# Patient Record
Sex: Female | Born: 1985 | Race: Black or African American | Hispanic: No | Marital: Married | Smoking: Never smoker
Health system: Southern US, Community
[De-identification: ages and names within clinical notes are randomized; demographics above are authoritative.]

## PROBLEM LIST (undated history)

## (undated) DIAGNOSIS — D649 Anemia, unspecified: Secondary | ICD-10-CM

## (undated) DIAGNOSIS — K219 Gastro-esophageal reflux disease without esophagitis: Secondary | ICD-10-CM

## (undated) DIAGNOSIS — K829 Disease of gallbladder, unspecified: Secondary | ICD-10-CM

---

## 2007-03-09 ENCOUNTER — Emergency Department (HOSPITAL_COMMUNITY): Admission: EM | Admit: 2007-03-09 | Discharge: 2007-03-09 | Payer: Self-pay | Admitting: Emergency Medicine

## 2009-12-10 ENCOUNTER — Other Ambulatory Visit: Admission: RE | Admit: 2009-12-10 | Discharge: 2009-12-10 | Payer: Self-pay | Admitting: Obstetrics & Gynecology

## 2009-12-25 ENCOUNTER — Emergency Department (HOSPITAL_COMMUNITY)
Admission: EM | Admit: 2009-12-25 | Discharge: 2009-12-26 | Payer: Self-pay | Source: Home / Self Care | Admitting: Emergency Medicine

## 2010-04-24 ENCOUNTER — Inpatient Hospital Stay (HOSPITAL_COMMUNITY): Admission: AD | Admit: 2010-04-24 | Discharge: 2010-04-27 | Payer: Self-pay | Admitting: Obstetrics & Gynecology

## 2010-04-24 ENCOUNTER — Ambulatory Visit: Payer: Self-pay | Admitting: Advanced Practice Midwife

## 2010-08-17 LAB — COMPREHENSIVE METABOLIC PANEL
BUN: 6 mg/dL (ref 6–23)
Calcium: 8.8 mg/dL (ref 8.4–10.5)
Chloride: 106 mEq/L (ref 96–112)
Creatinine, Ser: 0.8 mg/dL (ref 0.4–1.2)
GFR calc Af Amer: >60 mL/min (ref >60)
GFR calc non Af Amer: >60 mL/min (ref >60)
Potassium: 3.8 mEq/L (ref 3.5–5.1)
Sodium: 135 mEq/L (ref 135–145)

## 2010-08-17 LAB — CBC
HCT: 32.4 % — ABNORMAL LOW (ref 36.0–46.0)
HCT: 32.4 % — ABNORMAL LOW (ref 36.0–46.0)
MCH: 25.7 pg — ABNORMAL LOW (ref 26.0–34.0)
MCHC: 32.7 g/dL (ref 30.0–36.0)
MCV: 78.8 fL (ref 78.0–100.0)
Platelets: 282 10*3/uL (ref 150–400)
RBC: 4.11 MIL/uL (ref 3.87–5.11)
RDW: 16.2 % — ABNORMAL HIGH (ref 11.5–15.5)

## 2010-08-17 LAB — MRSA PCR SCREENING: MRSA by PCR: NEGATIVE

## 2010-08-21 LAB — DIFFERENTIAL
Eosinophils Absolute: 0.2 10*3/uL (ref 0.0–0.7)
Eosinophils Relative: 1 % (ref 0–5)
Lymphocytes Relative: 24 % (ref 12–46)
Lymphs Abs: 2.7 10*3/uL (ref 0.7–4.0)
Monocytes Absolute: 0.7 10*3/uL (ref 0.1–1.0)
Monocytes Relative: 6 % (ref 3–12)
Neutrophils Relative %: 68 % (ref 43–77)

## 2010-08-21 LAB — URINALYSIS, ROUTINE W REFLEX MICROSCOPIC

## 2010-08-21 LAB — CBC
HCT: 32.2 % — ABNORMAL LOW (ref 36.0–46.0)
MCH: 27.7 pg (ref 26.0–34.0)
RBC: 3.91 MIL/uL (ref 3.87–5.11)
RDW: 14.6 % (ref 11.5–15.5)

## 2010-08-21 LAB — BASIC METABOLIC PANEL
Creatinine, Ser: 0.62 mg/dL (ref 0.4–1.2)
GFR calc non Af Amer: >60 mL/min (ref >60)
Glucose, Bld: 87 mg/dL (ref 70–99)

## 2010-08-21 LAB — URINE MICROSCOPIC-ADD ON

## 2012-01-10 LAB — OB RESULTS CONSOLE ANTIBODY SCREEN: Antibody Screen: NEGATIVE

## 2012-01-10 LAB — OB RESULTS CONSOLE VARICELLA ZOSTER ANTIBODY, IGG: Varicella: IMMUNE

## 2012-01-10 LAB — OB RESULTS CONSOLE ABO/RH

## 2012-01-10 LAB — OB RESULTS CONSOLE GC/CHLAMYDIA: Chlamydia: NEGATIVE

## 2012-01-10 LAB — OB RESULTS CONSOLE RPR: RPR: NONREACTIVE

## 2012-01-10 LAB — OB RESULTS CONSOLE HEPATITIS B SURFACE ANTIGEN: Hepatitis B Surface Ag: NEGATIVE

## 2012-01-10 LAB — OB RESULTS CONSOLE RUBELLA ANTIBODY, IGM: Rubella: NON-IMMUNE/NOT IMMUNE

## 2012-01-12 ENCOUNTER — Other Ambulatory Visit (HOSPITAL_COMMUNITY)
Admission: RE | Admit: 2012-01-12 | Discharge: 2012-01-12 | Disposition: A | Discharge status: Home / Self Care | Payer: 59 | Source: Ambulatory Visit | Attending: Obstetrics and Gynecology | Admitting: Obstetrics and Gynecology

## 2012-01-12 ENCOUNTER — Other Ambulatory Visit: Payer: Self-pay | Admitting: Adult Health

## 2012-01-12 DIAGNOSIS — Z113 Encounter for screening for infections with a predominantly sexual mode of transmission: Secondary | ICD-10-CM | POA: Insufficient documentation

## 2012-01-12 DIAGNOSIS — Z01419 Encounter for gynecological examination (general) (routine) without abnormal findings: Secondary | ICD-10-CM | POA: Insufficient documentation

## 2012-01-14 ENCOUNTER — Encounter (HOSPITAL_COMMUNITY): Payer: Self-pay | Admitting: *Deleted

## 2012-01-14 ENCOUNTER — Emergency Department (HOSPITAL_COMMUNITY)
Admission: EM | Admit: 2012-01-14 | Discharge: 2012-01-14 | Disposition: A | Discharge status: Home / Self Care | Payer: 59 | Attending: Emergency Medicine | Admitting: Emergency Medicine

## 2012-01-14 DIAGNOSIS — O2390 Unspecified genitourinary tract infection in pregnancy, unspecified trimester: Secondary | ICD-10-CM | POA: Insufficient documentation

## 2012-01-14 DIAGNOSIS — N390 Urinary tract infection, site not specified: Secondary | ICD-10-CM | POA: Insufficient documentation

## 2012-01-14 DIAGNOSIS — Z3490 Encounter for supervision of normal pregnancy, unspecified, unspecified trimester: Secondary | ICD-10-CM

## 2012-01-14 HISTORY — DX: Disease of gallbladder, unspecified: K82.9

## 2012-01-14 LAB — URINALYSIS, ROUTINE W REFLEX MICROSCOPIC
Glucose, UA: NEGATIVE mg/dL
Nitrite: NEGATIVE

## 2012-01-14 LAB — URINE MICROSCOPIC-ADD ON

## 2012-01-14 LAB — CBC WITH DIFFERENTIAL/PLATELET
Basophils Absolute: 0 10*3/uL (ref 0.0–0.1)
Eosinophils Absolute: 0.1 10*3/uL (ref 0.0–0.7)
Hemoglobin: 10.1 g/dL — ABNORMAL LOW (ref 12.0–15.0)
Lymphocytes Relative: 35 % (ref 12–46)
Lymphs Abs: 2 10*3/uL (ref 0.7–4.0)
MCHC: 32.8 g/dL (ref 30.0–36.0)
MCV: 74.6 fL — ABNORMAL LOW (ref 78.0–100.0)
Monocytes Absolute: 0.3 10*3/uL (ref 0.1–1.0)
Neutrophils Relative %: 58 % (ref 43–77)
Platelets: 335 10*3/uL (ref 150–400)
RBC: 4.13 MIL/uL (ref 3.87–5.11)

## 2012-01-14 LAB — BASIC METABOLIC PANEL: Sodium: 133 mEq/L — ABNORMAL LOW (ref 135–145)

## 2012-01-14 MED ORDER — AMOXICILLIN 500 MG PO CAPS: 500.0000 mg | ORAL_CAPSULE | Freq: Three times a day (TID) | ORAL | Status: AC

## 2012-01-14 MED ORDER — AMOXICILLIN 250 MG PO CAPS
500.0000 mg | ORAL_CAPSULE | Freq: Once | ORAL | Status: AC
  Administered 2012-01-14: 500 mg via ORAL
  Filled 2012-01-14: qty 2

## 2012-01-14 NOTE — ED Notes (Signed)
Discharge instructions reviewed with pt, questions answered. Pt verbalized understanding.  

## 2012-01-14 NOTE — ED Provider Notes (Signed)
History  This chart was scribed for Donnetta Hutching, MD by Bennett Scrape. This patient was seen in room APA04/APA04 and the patient's care was started at 11:35AM.  CSN: [id number]  Arrival date & time 01/14/12  1027   First MD Initiated Contact with Patient 01/14/12 1135      Chief Complaint  Patient presents with  . Abdominal Pain  . Vaginal Bleeding    The history is provided by the patient. No language interpreter was used.    Kellie Clark is a 26 y.o. female who is currently [redacted] weeks pregnant (confirmed by Dr. Rayna Sexton office) who presents to the Emergency Department complaining of 24 hours of intermittent vaginal bleeding described as light spotting that has increased to clotting with associated abdominal cramping. Pt states that she started spotting light red blood yesterday for about one hour. She reports that the spotting started back up this morning with increased clotting and associated abdominal cramping. She reports that her LNMP was 11/17/2011 and her due date is 08/23/2012. She is G2P1 and reports that the first pregnancy was full term with no complications other than a mild gallbladder inflammation that resolved after delivery. She denies having any current gallbladder issues with this pregnancy. She reports that she was seen by Dr. Emelda Fear 4 days ago and had a normal [place]. She denies having any modifying factors. She denies having prior episodes of similar symptoms during either pregnancy. She denies fever, sore throat, visual disturbance, CP, SOB, nausea, emesis, diarrhea, urinary symptoms, back pain, HA and rash as associated symptoms. She does not have a h/o chronic medical conditions. She denies smoking and alcohol use.  Dr. Emelda Fear is OBGYN.  Past Medical History  Diagnosis Date  . Gallbladder disease     ? swollen during last pregnancy     History reviewed. No pertinent past surgical history.  No family history on file.  History  Substance Use Topics  . Smoking  status: Never Smoker   . Smokeless tobacco: Not on file  . Alcohol Use: No    OB History    Grav Para Term Preterm Abortions TAB SAB Ect Mult Living   2 1 1       1       Review of Systems  A complete 10 system review of systems was obtained and all systems are negative except as noted in the HPI and PMH.   Allergies  Other  Home Medications  No current outpatient prescriptions on file.  Triage Vitals: BP 130/89  Pulse 91  Temp 98.6 F (37 C)  Resp 20  Ht 5\' 4"  (1.626 m)  Wt 202 lb (91.627 kg)  BMI 34.67 kg/m2  SpO2 97%  LMP 11/17/2011  Physical Exam  Nursing note and vitals reviewed. Constitutional: She is oriented to person, place, and time. She appears well-developed and well-nourished. No distress.  HENT:  Head: Normocephalic and atraumatic.  Eyes: EOM are normal.  Neck: Neck supple. No tracheal deviation present.  Cardiovascular: Normal rate.   Pulmonary/Chest: Effort normal. No respiratory distress.  Abdominal: Soft. There is no tenderness.  Musculoskeletal: Normal range of motion.  Neurological: She is alert and oriented to person, place, and time.  Skin: Skin is warm and dry.  Psychiatric: She has a normal mood and affect. Her behavior is normal.    ED Course  Procedures (including critical care time)  DIAGNOSTIC STUDIES: Oxygen Saturation is 97% on room air, adequate by my interpretation.    COORDINATION OF CARE: 11:44AM-Informed pt that  she could be having a miscarriage but advised her that there is no clinical way to diagnosis a miscarriage at this point. Also advised the pt that there is no medical intervention options avaiable to stop a miscarriage. Discussed treatment plan which includes checking a quantitative HCG hormone level and an urinalysis with pt at bedside and pt agreed to plan. Advised pt to follow up with Dr. Emelda Fear to have HCG level rechecked.   Labs Reviewed  CBC WITH DIFFERENTIAL - Abnormal; Notable for the following:     Hemoglobin 10.1 (*)     HCT 30.8 (*)     MCV 74.6 (*)     MCH 24.5 (*)     RDW 16.4 (*)     All other components within normal limits  BASIC METABOLIC PANEL - Abnormal; Notable for the following:    Sodium 133 (*)     All other components within normal limits  URINALYSIS, ROUTINE W REFLEX MICROSCOPIC - Abnormal; Notable for the following:    APPearance HAZY (*)     Hgb urine dipstick LARGE (*)     Bilirubin Urine SMALL (*)     Urobilinogen, UA 4.0 (*)     All other components within normal limits  HCG, QUANTITATIVE, PREGNANCY - Abnormal; Notable for the following:    hCG, Beta Chain, Quant, S 95621 (*)     All other components within normal limits  URINE MICROSCOPIC-ADD ON - Abnormal; Notable for the following:    Squamous Epithelial / LPF MANY (*)     Bacteria, UA MANY (*)     All other components within normal limits   No results found.   No diagnosis found.    MDM  Urinalysis shows minor infection.  I discussed possibility of spontaneous abortion with the patient. She understands possibility of same.  Patient has had OB/GYN workup including pelvic exam recently.   pelvic exam is not  indicated today.    I personally performed the services described in this documentation, which was scribed in my presence. The recorded information has been reviewed and considered.      Donnetta Hutching, MD 01/14/12 [phone]

## 2012-01-14 NOTE — ED Notes (Signed)
Pt states that she is [redacted] weeks pregnant, pregnancy confirmed by Dr. Rayna Sexton office (obgyn), pt states that she started bleeding bright red blood on Friday, then stopped, started cramping last night, started bright red bleeding again during the night associated with blood clot's. Pt c/o abd cramping,  Pt states that the date of her last menstrual cycle was 11/17/2011, pt has due date of 08/23/2012. This is second pregnancy for pt, first pregnancy full term with no complications per pt

## 2012-01-17 LAB — URINE CULTURE

## 2012-06-06 NOTE — L&D Delivery Note (Signed)
I was present for entire delivery and repair of laceration. I agree with above resident note. Napoleon Form, MD

## 2012-06-06 NOTE — L&D Delivery Note (Signed)
Kellie Clark is a 27 y/o G2P1001 at 40.5 admitted for active labor. She had an uneventful delivery, note that her admission Hgb was 7.9.   Delivery Note At 6:03 AM a healthy female was delivered via Vaginal, Spontaneous Delivery (Presentation: ; Occiput Anterior).  APGAR: 8, 9; weight .   Placenta status:intact ,spontaneous .  Cord: three vessel  with the following complications: None.  Cord pH: n/a  Anesthesia: Epidural  Episiotomy: None Lacerations: 2nd degree Suture Repair: 3.0 vicryl Est. Blood Loss (mL): 300  Mom to postpartum.  Baby to nursery-stable.  Kevin Fenton 08/28/2012, 6:28 AM

## 2012-07-17 ENCOUNTER — Encounter: Payer: Self-pay | Admitting: *Deleted

## 2012-07-30 LAB — OB RESULTS CONSOLE GBS: GBS: NEGATIVE

## 2012-08-18 ENCOUNTER — Encounter: Payer: Self-pay | Admitting: *Deleted

## 2012-08-22 ENCOUNTER — Ambulatory Visit (INDEPENDENT_AMBULATORY_CARE_PROVIDER_SITE_OTHER): Payer: 59 | Admitting: Obstetrics & Gynecology

## 2012-08-22 VITALS — BP 140/80 | Wt 228.0 lb

## 2012-08-22 DIAGNOSIS — Z3490 Encounter for supervision of normal pregnancy, unspecified, unspecified trimester: Secondary | ICD-10-CM

## 2012-08-22 DIAGNOSIS — Z331 Pregnant state, incidental: Secondary | ICD-10-CM

## 2012-08-22 LAB — POCT URINALYSIS DIPSTICK
Glucose, UA: NEGATIVE
Ketones, UA: NEGATIVE

## 2012-08-22 NOTE — Progress Notes (Signed)
Patient reports good fetal movement, denies any bleeding and no rupture of membranes symptoms or contraction. Patient aware of induction at 41 weeks for post dates if needed.  Happened last pregnancy.

## 2012-08-22 NOTE — Progress Notes (Signed)
Pt c/o a lot of pressure and pain

## 2012-08-22 NOTE — Patient Instructions (Signed)
Pregnancy - Third Trimester  The third trimester of pregnancy (the last 3 months) is a period of the most rapid growth for you and your baby. The baby approaches a length of 20 inches and a weight of 6 to 10 pounds. The baby is adding on fat and getting ready for life outside your body. While inside, babies have periods of sleeping and waking, suck their thumbs, and hiccups. You can often feel small contractions of the uterus. This is false labor. It is also called Braxton-Hicks contractions. This is like a practice for labor. The usual problems in this stage of pregnancy include more difficulty breathing, swelling of the hands and feet from water retention, and having to urinate more often because of the uterus and baby pressing on your bladder.   PRENATAL EXAMS  · Blood work may continue to be done during prenatal exams. These tests are done to check on your health and the probable health of your baby. Blood work is used to follow your blood levels (hemoglobin). Anemia (low hemoglobin) is common during pregnancy. Iron and vitamins are given to help prevent this. You may also continue to be checked for diabetes. Some of the past blood tests may be done again.  · The size of the uterus is measured during each visit. This makes sure your baby is growing properly according to your pregnancy dates.  · Your blood pressure is checked every prenatal visit. This is to make sure you are not getting toxemia.  · Your urine is checked every prenatal visit for infection, diabetes and protein.  · Your weight is checked at each visit. This is done to make sure gains are happening at the suggested rate and that you and your baby are growing normally.  · Sometimes, an ultrasound is performed to confirm the position and the proper growth and development of the baby. This is a test done that bounces harmless sound waves off the baby so your caregiver can more accurately determine due dates.  · Discuss the type of pain medication and  anesthesia you will have during your labor and delivery.  · Discuss the possibility and anesthesia if a Cesarean Section might be necessary.  · Inform your caregiver if there is any mental or physical violence at home.  Sometimes, a specialized non-stress test, contraction stress test and biophysical profile are done to make sure the baby is not having a problem. Checking the amniotic fluid surrounding the baby is called an amniocentesis. The amniotic fluid is removed by sticking a needle into the belly (abdomen). This is sometimes done near the end of pregnancy if an early delivery is required. In this case, it is done to help make sure the baby's lungs are mature enough for the baby to live outside of the womb. If the lungs are not mature and it is unsafe to deliver the baby, an injection of cortisone medication is given to the mother 1 to 2 days before the delivery. This helps the baby's lungs mature and makes it safer to deliver the baby.  CHANGES OCCURING IN THE THIRD TRIMESTER OF PREGNANCY  Your body goes through many changes during pregnancy. They vary from person to person. Talk to your caregiver about changes you notice and are concerned about.  · During the last trimester, you have probably had an increase in your appetite. It is normal to have cravings for certain foods. This varies from person to person and pregnancy to pregnancy.  · You may begin to   get stretch marks on your hips, abdomen, and breasts. These are normal changes in the body during pregnancy. There are no exercises or medications to take which prevent this change.  · Constipation may be treated with a stool softener or adding bulk to your diet. Drinking lots of fluids, fiber in vegetables, fruits, and whole grains are helpful.  · Exercising is also helpful. If you have been very active up until your pregnancy, most of these activities can be continued during your pregnancy. If you have been less active, it is helpful to start an exercise  program such as walking. Consult your caregiver before starting exercise programs.  · Avoid all smoking, alcohol, un-prescribed drugs, herbs and "street drugs" during your pregnancy. These chemicals affect the formation and growth of the baby. Avoid chemicals throughout the pregnancy to ensure the delivery of a healthy infant.  · Backache, varicose veins and hemorrhoids may develop or get worse.  · You will tire more easily in the third trimester, which is normal.  · The baby's movements may be stronger and more often.  · You may become short of breath easily.  · Your belly button may stick out.  · A yellow discharge may leak from your breasts called colostrum.  · You may have a bloody mucus discharge. This usually occurs a few days to a week before labor begins.  HOME CARE INSTRUCTIONS   · Keep your caregiver's appointments. Follow your caregiver's instructions regarding medication use, exercise, and diet.  · During pregnancy, you are providing food for you and your baby. Continue to eat regular, well-balanced meals. Choose foods such as meat, fish, milk and other low fat dairy products, vegetables, fruits, and whole-grain breads and cereals. Your caregiver will tell you of the ideal weight gain.  · A physical sexual relationship may be continued throughout pregnancy if there are no other problems such as early (premature) leaking of amniotic fluid from the membranes, vaginal bleeding, or belly (abdominal) pain.  · Exercise regularly if there are no restrictions. Check with your caregiver if you are unsure of the safety of your exercises. Greater weight gain will occur in the last 2 trimesters of pregnancy. Exercising helps:  · Control your weight.  · Get you in shape for labor and delivery.  · You lose weight after you deliver.  · Rest a lot with legs elevated, or as needed for leg cramps or low back pain.  · Wear a good support or jogging bra for breast tenderness during pregnancy. This may help if worn during  sleep. Pads or tissues may be used in the bra if you are leaking colostrum.  · Do not use hot tubs, steam rooms, or saunas.  · Wear your seat belt when driving. This protects you and your baby if you are in an accident.  · Avoid raw meat, cat litter boxes and soil used by cats. These carry germs that can cause birth defects in the baby.  · It is easier to loose urine during pregnancy. Tightening up and strengthening the pelvic muscles will help with this problem. You can practice stopping your urination while you are going to the bathroom. These are the same muscles you need to strengthen. It is also the muscles you would use if you were trying to stop from passing gas. You can practice tightening these muscles up 10 times a set and repeating this about 3 times per day. Once you know what muscles to tighten up, do not perform these   exercises during urination. It is more likely to cause an infection by backing up the urine.  · Ask for help if you have financial, counseling or nutritional needs during pregnancy. Your caregiver will be able to offer counseling for these needs as well as refer you for other special needs.  · Make a list of emergency phone numbers and have them available.  · Plan on getting help from family or friends when you go home from the hospital.  · Make a trial run to the hospital.  · Take prenatal classes with the father to understand, practice and ask questions about the labor and delivery.  · Prepare the baby's room/nursery.  · Do not travel out of the city unless it is absolutely necessary and with the advice of your caregiver.  · Wear only low or no heal shoes to have better balance and prevent falling.  MEDICATIONS AND DRUG USE IN PREGNANCY  · Take prenatal vitamins as directed. The vitamin should contain 1 milligram of folic acid. Keep all vitamins out of reach of children. Only a couple vitamins or tablets containing iron may be fatal to a baby or young child when ingested.  · Avoid use  of all medications, including herbs, over-the-counter medications, not prescribed or suggested by your caregiver. Only take over-the-counter or prescription medicines for pain, discomfort, or fever as directed by your caregiver. Do not use aspirin, ibuprofen (Motrin®, Advil®, Nuprin®) or naproxen (Aleve®) unless OK'd by your caregiver.  · Let your caregiver also know about herbs you may be using.  · Alcohol is related to a number of birth defects. This includes fetal alcohol syndrome. All alcohol, in any form, should be avoided completely. Smoking will cause low birth rate and premature babies.  · Street/illegal drugs are very harmful to the baby. They are absolutely forbidden. A baby born to an addicted mother will be addicted at birth. The baby will go through the same withdrawal an adult does.  SEEK MEDICAL CARE IF:  You have any concerns or worries during your pregnancy. It is better to call with your questions if you feel they cannot wait, rather than worry about them.  DECISIONS ABOUT CIRCUMCISION  You may or may not know the sex of your baby. If you know your baby is a boy, it may be time to think about circumcision. Circumcision is the removal of the foreskin of the penis. This is the skin that covers the sensitive end of the penis. There is no proven medical need for this. Often this decision is made on what is popular at the time or based upon religious beliefs and social issues. You can discuss these issues with your caregiver or pediatrician.  SEEK IMMEDIATE MEDICAL CARE IF:   · An unexplained oral temperature above 102° F (38.9° C) develops, or as your caregiver suggests.  · You have leaking of fluid from the vagina (birth canal). If leaking membranes are suspected, take your temperature and tell your caregiver of this when you call.  · There is vaginal spotting, bleeding or passing clots. Tell your caregiver of the amount and how many pads are used.  · You develop a bad smelling vaginal discharge with  a change in the color from clear to white.  · You develop vomiting that lasts more than 24 hours.  · You develop chills or fever.  · You develop shortness of breath.  · You develop burning on urination.  · You loose more than 2 pounds of weight   or gain more than 2 pounds of weight or as suggested by your caregiver.  · You notice sudden swelling of your face, hands, and feet or legs.  · You develop belly (abdominal) pain. Round ligament discomfort is a common non-cancerous (benign) cause of abdominal pain in pregnancy. Your caregiver still must evaluate you.  · You develop a severe headache that does not go away.  · You develop visual problems, blurred or double vision.  · If you have not felt your baby move for more than 1 hour. If you think the baby is not moving as much as usual, eat something with sugar in it and lie down on your left side for an hour. The baby should move at least 4 to 5 times per hour. Call right away if your baby moves less than that.  · You fall, are in a car accident or any kind of trauma.  · There is mental or physical violence at home.  Document Released: 05/17/2001 Document Revised: 08/15/2011 Document Reviewed: 11/19/2008  [employer]® Patient Information ©2013 [employer].

## 2012-08-28 ENCOUNTER — Encounter (HOSPITAL_COMMUNITY): Payer: Self-pay

## 2012-08-28 ENCOUNTER — Encounter (HOSPITAL_COMMUNITY): Payer: Self-pay | Admitting: Anesthesiology

## 2012-08-28 ENCOUNTER — Inpatient Hospital Stay (HOSPITAL_COMMUNITY)
Admission: AD | Admit: 2012-08-28 | Discharge: 2012-08-30 | DRG: 774 | Disposition: A | Discharge status: Home / Self Care | Payer: Managed Care, Other (non HMO) | Source: Ambulatory Visit | Attending: Obstetrics & Gynecology | Admitting: Obstetrics & Gynecology

## 2012-08-28 ENCOUNTER — Inpatient Hospital Stay (HOSPITAL_COMMUNITY): Payer: Managed Care, Other (non HMO) | Admitting: Anesthesiology

## 2012-08-28 DIAGNOSIS — D649 Anemia, unspecified: Secondary | ICD-10-CM | POA: Diagnosis not present

## 2012-08-28 DIAGNOSIS — IMO0002 Reserved for concepts with insufficient information to code with codable children: Secondary | ICD-10-CM

## 2012-08-28 DIAGNOSIS — O9903 Anemia complicating the puerperium: Secondary | ICD-10-CM | POA: Diagnosis not present

## 2012-08-28 HISTORY — DX: Anemia, unspecified: D64.9

## 2012-08-28 LAB — ABO/RH: ABO/RH(D): AB POS

## 2012-08-28 LAB — CBC
HCT: 26.3 % — ABNORMAL LOW (ref 36.0–46.0)
Hemoglobin: 7.9 g/dL — ABNORMAL LOW (ref 12.0–15.0)
MCHC: 30 g/dL (ref 30.0–36.0)
MCV: 62.6 fL — ABNORMAL LOW (ref 78.0–100.0)

## 2012-08-28 LAB — COMPREHENSIVE METABOLIC PANEL
ALT: 23 U/L (ref 0–35)
AST: 20 U/L (ref 0–37)
Albumin: 2.9 g/dL — ABNORMAL LOW (ref 3.5–5.2)
Alkaline Phosphatase: 168 U/L — ABNORMAL HIGH (ref 39–117)
Chloride: 102 mEq/L (ref 96–112)
Potassium: 3.7 mEq/L (ref 3.5–5.1)
Sodium: 134 mEq/L — ABNORMAL LOW (ref 135–145)
Total Protein: 8.2 g/dL (ref 6.0–8.3)

## 2012-08-28 LAB — MRSA PCR SCREENING: MRSA by PCR: NEGATIVE

## 2012-08-28 LAB — PROTEIN / CREATININE RATIO, URINE
Creatinine, Urine: 192.61 mg/dL
Protein Creatinine Ratio: 0.82 — ABNORMAL HIGH (ref 0.00–0.15)
Total Protein, Urine: 157.2 mg/dL

## 2012-08-28 MED ORDER — TETANUS-DIPHTH-ACELL PERTUSSIS 5-2.5-18.5 LF-MCG/0.5 IM SUSP
0.5000 mL | Freq: Once | INTRAMUSCULAR | Status: AC
  Administered 2012-08-29: 0.5 mL via INTRAMUSCULAR
  Filled 2012-08-28: qty 0.5

## 2012-08-28 MED ORDER — FENTANYL 2.5 MCG/ML BUPIVACAINE 1/10 % EPIDURAL INFUSION (WH - ANES)
14.0000 mL/h | INTRAMUSCULAR | Status: DC | PRN
  Filled 2012-08-28: qty 125

## 2012-08-28 MED ORDER — PHENYLEPHRINE 40 MCG/ML (10ML) SYRINGE FOR IV PUSH (FOR BLOOD PRESSURE SUPPORT)
80.0000 ug | PREFILLED_SYRINGE | INTRAVENOUS | Status: DC | PRN
  Filled 2012-08-28: qty 2
  Filled 2012-08-28: qty 5

## 2012-08-28 MED ORDER — LACTATED RINGERS IV SOLN
INTRAVENOUS | Status: DC
  Administered 2012-08-28 – 2012-08-29 (×2): via INTRAVENOUS

## 2012-08-28 MED ORDER — OXYTOCIN BOLUS FROM INFUSION
500.0000 mL | INTRAVENOUS | Status: DC
  Administered 2012-08-28: 500 mL via INTRAVENOUS

## 2012-08-28 MED ORDER — HYDROCHLOROTHIAZIDE 25 MG PO TABS
25.0000 mg | ORAL_TABLET | Freq: Every day | ORAL | Status: DC
  Administered 2012-08-28 – 2012-08-30 (×3): 25 mg via ORAL
  Filled 2012-08-28 (×3): qty 1

## 2012-08-28 MED ORDER — OXYTOCIN 40 UNITS IN LACTATED RINGERS INFUSION - SIMPLE MED
62.5000 mL/h | INTRAVENOUS | Status: DC
  Filled 2012-08-28: qty 1000

## 2012-08-28 MED ORDER — WITCH HAZEL-GLYCERIN EX PADS: 1.0000 "application " | MEDICATED_PAD | CUTANEOUS | Status: DC | PRN

## 2012-08-28 MED ORDER — PHENYLEPHRINE 40 MCG/ML (10ML) SYRINGE FOR IV PUSH (FOR BLOOD PRESSURE SUPPORT)
80.0000 ug | PREFILLED_SYRINGE | INTRAVENOUS | Status: DC | PRN
  Filled 2012-08-28: qty 2

## 2012-08-28 MED ORDER — MAGNESIUM SULFATE BOLUS VIA INFUSION
4.0000 g | Freq: Once | INTRAVENOUS | Status: AC
  Administered 2012-08-28: 4 g via INTRAVENOUS
  Filled 2012-08-28: qty 500

## 2012-08-28 MED ORDER — PRENATAL MULTIVITAMIN CH
1.0000 | ORAL_TABLET | Freq: Every day | ORAL | Status: DC
  Administered 2012-08-28 – 2012-08-29 (×2): 1 via ORAL
  Filled 2012-08-28 (×2): qty 1

## 2012-08-28 MED ORDER — LIDOCAINE HCL (PF) 1 % IJ SOLN
INTRAMUSCULAR | Status: DC | PRN
  Administered 2012-08-28 (×2): 4 mL

## 2012-08-28 MED ORDER — FERROUS SULFATE 325 (65 FE) MG PO TABS
325.0000 mg | ORAL_TABLET | Freq: Two times a day (BID) | ORAL | Status: DC
  Administered 2012-08-28 – 2012-08-30 (×4): 325 mg via ORAL
  Filled 2012-08-28 (×4): qty 1

## 2012-08-28 MED ORDER — ACETAMINOPHEN 325 MG PO TABS: 650.0000 mg | ORAL_TABLET | ORAL | Status: DC | PRN

## 2012-08-28 MED ORDER — LANOLIN HYDROUS EX OINT: TOPICAL_OINTMENT | CUTANEOUS | Status: DC | PRN

## 2012-08-28 MED ORDER — MAGNESIUM SULFATE 40 MG/ML IJ SOLN: 2.0000 g | INTRAMUSCULAR | Status: DC

## 2012-08-28 MED ORDER — IBUPROFEN 600 MG PO TABS: 600.0000 mg | ORAL_TABLET | Freq: Four times a day (QID) | ORAL | Status: DC | PRN

## 2012-08-28 MED ORDER — LACTATED RINGERS IV SOLN: 500.0000 mL | INTRAVENOUS | Status: DC | PRN

## 2012-08-28 MED ORDER — DIPHENHYDRAMINE HCL 25 MG PO CAPS: 25.0000 mg | ORAL_CAPSULE | Freq: Four times a day (QID) | ORAL | Status: DC | PRN

## 2012-08-28 MED ORDER — MAGNESIUM SULFATE 40 G IN LACTATED RINGERS - SIMPLE
2.0000 g/h | INTRAVENOUS | Status: DC
  Filled 2012-08-28: qty 500

## 2012-08-28 MED ORDER — OXYCODONE-ACETAMINOPHEN 5-325 MG PO TABS
1.0000 | ORAL_TABLET | ORAL | Status: DC | PRN
  Administered 2012-08-29 – 2012-08-30 (×2): 1 via ORAL
  Filled 2012-08-28 (×2): qty 1

## 2012-08-28 MED ORDER — ONDANSETRON HCL 4 MG PO TABS: 4.0000 mg | ORAL_TABLET | ORAL | Status: DC | PRN

## 2012-08-28 MED ORDER — LIDOCAINE HCL (PF) 1 % IJ SOLN
30.0000 mL | INTRAMUSCULAR | Status: DC | PRN
  Filled 2012-08-28 (×2): qty 30

## 2012-08-28 MED ORDER — FENTANYL 2.5 MCG/ML BUPIVACAINE 1/10 % EPIDURAL INFUSION (WH - ANES): 14.0000 mL/h | INTRAMUSCULAR | Status: DC | PRN

## 2012-08-28 MED ORDER — MAGNESIUM SULFATE 40 MG/ML IJ SOLN
4.0000 g | Freq: Once | INTRAMUSCULAR | Status: DC
  Filled 2012-08-28: qty 100

## 2012-08-28 MED ORDER — LACTATED RINGERS IV SOLN
500.0000 mL | Freq: Once | INTRAVENOUS | Status: AC
  Administered 2012-08-28: 500 mL via INTRAVENOUS

## 2012-08-28 MED ORDER — DIBUCAINE 1 % RE OINT: 1.0000 "application " | TOPICAL_OINTMENT | RECTAL | Status: DC | PRN

## 2012-08-28 MED ORDER — DIPHENHYDRAMINE HCL 50 MG/ML IJ SOLN: 12.5000 mg | INTRAMUSCULAR | Status: DC | PRN

## 2012-08-28 MED ORDER — BENZOCAINE-MENTHOL 20-0.5 % EX AERO
1.0000 "application " | INHALATION_SPRAY | CUTANEOUS | Status: DC | PRN
  Filled 2012-08-28: qty 56

## 2012-08-28 MED ORDER — SIMETHICONE 80 MG PO CHEW: 80.0000 mg | CHEWABLE_TABLET | ORAL | Status: DC | PRN

## 2012-08-28 MED ORDER — ZOLPIDEM TARTRATE 5 MG PO TABS: 5.0000 mg | ORAL_TABLET | Freq: Every evening | ORAL | Status: DC | PRN

## 2012-08-28 MED ORDER — CITRIC ACID-SODIUM CITRATE 334-500 MG/5ML PO SOLN: 30.0000 mL | ORAL | Status: DC | PRN

## 2012-08-28 MED ORDER — AMLODIPINE BESYLATE 5 MG PO TABS
5.0000 mg | ORAL_TABLET | Freq: Every day | ORAL | Status: DC
  Administered 2012-08-28 – 2012-08-29 (×2): 5 mg via ORAL
  Filled 2012-08-28 (×2): qty 1

## 2012-08-28 MED ORDER — EPHEDRINE 5 MG/ML INJ
10.0000 mg | INTRAVENOUS | Status: DC | PRN
  Filled 2012-08-28: qty 2

## 2012-08-28 MED ORDER — FENTANYL 2.5 MCG/ML BUPIVACAINE 1/10 % EPIDURAL INFUSION (WH - ANES)
INTRAMUSCULAR | Status: DC | PRN
  Administered 2012-08-28: 14 mL/h via EPIDURAL

## 2012-08-28 MED ORDER — SENNOSIDES-DOCUSATE SODIUM 8.6-50 MG PO TABS
2.0000 | ORAL_TABLET | Freq: Every day | ORAL | Status: DC
  Administered 2012-08-28 – 2012-08-29 (×2): 2 via ORAL

## 2012-08-28 MED ORDER — ONDANSETRON HCL 4 MG/2ML IJ SOLN: 4.0000 mg | INTRAMUSCULAR | Status: DC | PRN

## 2012-08-28 MED ORDER — LACTATED RINGERS IV SOLN
INTRAVENOUS | Status: DC
  Administered 2012-08-28: 04:00:00 via INTRAVENOUS

## 2012-08-28 MED ORDER — MAGNESIUM SULFATE 40 G IN LACTATED RINGERS - SIMPLE
2.0000 g/h | INTRAVENOUS | Status: DC
  Administered 2012-08-29: 2 g/h via INTRAVENOUS
  Filled 2012-08-28 (×2): qty 500

## 2012-08-28 MED ORDER — OXYCODONE-ACETAMINOPHEN 5-325 MG PO TABS: 1.0000 | ORAL_TABLET | ORAL | Status: DC | PRN

## 2012-08-28 MED ORDER — IBUPROFEN 600 MG PO TABS
600.0000 mg | ORAL_TABLET | Freq: Four times a day (QID) | ORAL | Status: DC
  Administered 2012-08-28 – 2012-08-30 (×9): 600 mg via ORAL
  Filled 2012-08-28 (×9): qty 1

## 2012-08-28 MED ORDER — FENTANYL CITRATE 0.05 MG/ML IJ SOLN
100.0000 ug | INTRAMUSCULAR | Status: DC | PRN
  Administered 2012-08-28: 100 ug via INTRAVENOUS
  Filled 2012-08-28: qty 2

## 2012-08-28 MED ORDER — ONDANSETRON HCL 4 MG/2ML IJ SOLN: 4.0000 mg | Freq: Four times a day (QID) | INTRAMUSCULAR | Status: DC | PRN

## 2012-08-28 MED ORDER — FLEET ENEMA 7-19 GM/118ML RE ENEM: 1.0000 | ENEMA | RECTAL | Status: DC | PRN

## 2012-08-28 MED ORDER — EPHEDRINE 5 MG/ML INJ
10.0000 mg | INTRAVENOUS | Status: DC | PRN
  Filled 2012-08-28: qty 2
  Filled 2012-08-28: qty 4

## 2012-08-28 NOTE — Progress Notes (Signed)
Admission nutrition screen triggered. Patients chart reviewed and assessed  for nutritional risk. Patient is determined to be at low nutrition  risk.   Roshunda Keir M.Ed. R.D. LDN Neonatal Nutrition Support Specialist Pager [phone]  

## 2012-08-28 NOTE — MAU Note (Signed)
contractions 

## 2012-08-28 NOTE — MAU Note (Signed)
Contractions since 10:00 p.m.

## 2012-08-28 NOTE — Anesthesia Procedure Notes (Signed)
Epidural Patient location during procedure: OB Start time: 08/28/2012 4:06 AM  Staffing Anesthesiologist: Britlyn Martine A. Performed by: anesthesiologist   Preanesthetic Checklist Completed: patient identified, site marked, surgical consent, pre-op evaluation, timeout performed, IV checked, risks and benefits discussed and monitors and equipment checked  Epidural Patient position: sitting Prep: site prepped and draped and DuraPrep Patient monitoring: continuous pulse ox and blood pressure Approach: midline Injection technique: LOR air  Needle:  Needle type: Tuohy  Needle gauge: 17 G Needle length: 9 cm and 9 Needle insertion depth: 7 cm Catheter type: closed end flexible Catheter size: 19 Gauge Catheter at skin depth: 12 cm Test dose: negative and Other  Assessment Events: blood not aspirated, injection not painful, no injection resistance, negative IV test and no paresthesia  Additional Notes Patient identified. Risks and benefits discussed including failed block, incomplete  Pain control, post dural puncture headache, nerve damage, paralysis, blood pressure Changes, nausea, vomiting, reactions to medications-both toxic and allergic and post Partum back pain. All questions were answered. Patient expressed understanding and wished to proceed. Sterile technique was used throughout procedure. Epidural site was Dressed with sterile barrier dressing. No paresthesias, signs of intravascular injection Or signs of intrathecal spread were encountered.  Patient was more comfortable after the epidural was dosed. Please see RN's note for documentation of vital signs and FHR which are stable.

## 2012-08-28 NOTE — H&P (Signed)
Kellie Clark is a 27 y.o. female presenting for Contractions. Her contractions began around 10 pm, this evening and they have gotten more intense and closer together since that time. She states that they are now a 10/10. She denies vaginal bleeding, discharge, irritation, and decreased fetal movement. She states that she has had a trickle of fluid throughout the day. She denies headache, vision change, dyspnea, chest pain, dysuria, and swelling.  History OB History   Grav Para Term Preterm Abortions TAB SAB Ect Mult Living   2 1 1       1      Past Medical History  Diagnosis Date  . Gallbladder disease     ? swollen during last pregnancy   . Anemia    No past surgical history on file. Family History: family history includes Cancer in her paternal grandmother; Diabetes in her other; Heart disease in her other; Hypertension in her other; and Stroke in her other. Social History:  reports that she has never smoked. She does not have any smokeless tobacco history on file. She reports that she does not drink alcohol or use illicit drugs.   Prenatal Transfer Tool  Maternal Diabetes: No Genetic Screening: Normal Maternal Ultrasounds/Referrals: Normal Fetal Ultrasounds or other Referrals:  None Maternal Substance Abuse:  No Significant Maternal Medications:  None Significant Maternal Lab Results:  Lab values include: Group B Strep negative Other Comments:  None  ROS Per HPI  Dilation: 3 Effacement (%): 70 Station: -2 Exam by:: Murtis Sink MD resident Blood pressure 143/95, pulse 104, last menstrual period 11/17/2011. Exam Physical Exam   Gen: NAD, alert, cooperative with exam HEENT: NCAT, EOMI, PERRL CV: RRR, good S1/S2, no murmur Resp: CTABL, no wheezes, non-labored Abd: Soft, pregnant, non tender  Ext: No edema Neuro: Alert and oriented, No gross deficits  FHT: baseline 130, moderate variability, accels present, decels: possible variable but possibleartifact Toco: q2-3  minutes  Prenatal labs: ABO, Rh: AB/Positive/-- (08/06 0000) Antibody: Negative (08/06 0000) Rubella: Nonimmune (08/06 0000) RPR: Nonreactive (12/18 0000)  HBsAg: Negative (08/06 0000)  HIV: Non-reactive (12/18 0000)  GBS: Negative (02/24 0000)   Assessment/Plan: 27 y/o G2P1001 at 40.5 here in early labor.  - Early labor, given her pain, gestational age, and consistent contractions we will admit to birthing suites - Category 2 Strip, good variability and accels, possibly artifact instead of deceleration - GBS negative - Fentanyl PRN for pain, epidural by request - Anticipate SVD   Kevin Fenton 08/28/2012, 3:13 AM  I saw and examined patient, reviewed prenatal records, labs. I also reviewed fetal heart tracing, which is overall reassuring. I agree with above resident note. Anticipate SVD. Napoleon Form, MD

## 2012-08-28 NOTE — Progress Notes (Signed)
UR chart review completed.  

## 2012-08-28 NOTE — Anesthesia Preprocedure Evaluation (Signed)
Anesthesia Evaluation  Patient identified by MRN, date of birth, ID band Patient awake    Reviewed: Allergy & Precautions, H&P , Patient's Chart, lab work & pertinent test results  Airway Mallampati: III TM Distance: >3 FB Neck ROM: full    Dental no notable dental hx. (+) Teeth Intact   Pulmonary neg pulmonary ROS,  breath sounds clear to auscultation  Pulmonary exam normal       Cardiovascular negative cardio ROS  Rhythm:regular Rate:Normal     Neuro/Psych negative neurological ROS  negative psych ROS   GI/Hepatic negative GI ROS, Neg liver ROS,   Endo/Other  Morbid obesity  Renal/GU negative Renal ROS  negative genitourinary   Musculoskeletal   Abdominal Normal abdominal exam  (+)   Peds  Hematology negative hematology ROS (+) anemia ,   Anesthesia Other Findings   Reproductive/Obstetrics (+) Pregnancy                           Anesthesia Physical Anesthesia Plan  ASA: II  Anesthesia Plan: Epidural   Post-op Pain Management:    Induction:   Airway Management Planned:   Additional Equipment:   Intra-op Plan:   Post-operative Plan:   Informed Consent: I have reviewed the patients History and Physical, chart, labs and discussed the procedure including the risks, benefits and alternatives for the proposed anesthesia with the patient or authorized representative who has indicated his/her understanding and acceptance.     Plan Discussed with: Anesthesiologist  Anesthesia Plan Comments:         Anesthesia Quick Evaluation

## 2012-08-28 NOTE — MAU Note (Signed)
Patient sitting up to right side.

## 2012-08-29 ENCOUNTER — Encounter: Payer: 59 | Admitting: Advanced Practice Midwife

## 2012-08-29 LAB — CBC
HCT: 22.9 % — ABNORMAL LOW (ref 36.0–46.0)
Hemoglobin: 7.1 g/dL — ABNORMAL LOW (ref 12.0–15.0)
MCH: 19.6 pg — ABNORMAL LOW (ref 26.0–34.0)
MCHC: 31 g/dL (ref 30.0–36.0)

## 2012-08-29 MED ORDER — AMLODIPINE BESYLATE 10 MG PO TABS
10.0000 mg | ORAL_TABLET | Freq: Every day | ORAL | Status: DC
Start: 2012-08-30 — End: 2012-08-30
  Administered 2012-08-30: 10 mg via ORAL
  Filled 2012-08-29: qty 1

## 2012-08-29 MED ORDER — MEASLES, MUMPS & RUBELLA VAC ~~LOC~~ INJ
0.5000 mL | INJECTION | Freq: Once | SUBCUTANEOUS | Status: DC
  Filled 2012-08-29: qty 0.5

## 2012-08-29 MED ORDER — AMLODIPINE BESYLATE 5 MG PO TABS
5.0000 mg | ORAL_TABLET | Freq: Once | ORAL | Status: AC
  Administered 2012-08-29: 5 mg via ORAL
  Filled 2012-08-29: qty 1

## 2012-08-29 NOTE — Progress Notes (Signed)
Pt. with increased BP and headache.  Wishes to send baby to nursery so that she can relax.  Baby sent to central nursery for approx. 5 hours.

## 2012-08-29 NOTE — Progress Notes (Signed)
Post Partum Day 1  Subjective: no complaints, up ad lib, voiding and tolerating PO  Objective: Blood pressure 146/87, pulse 78, temperature 98.5 F (36.9 C), temperature source Oral, resp. rate 18, height 5\' 4"  (1.626 m), weight 206 lb 4.8 oz (93.577 kg), last menstrual period 11/17/2011, SpO2 100.00%, unknown if currently breastfeeding.  Physical Exam:  General: alert, cooperative and no distress Lochia: appropriate Uterine Fundus: firm DVT Evaluation: No evidence of DVT seen on physical exam.   Recent Labs  08/28/12 0332 08/29/12 0535  HGB 7.9* 7.1*  HCT 26.3* 22.9*    Assessment/Plan: Contraception advised condoms, considering BTL Anemia Post preeclampsia. Transfer to MB from ICU. Increase to 10 mg Norvasc   LOS: 1 day   ARNOLD,JAMES 08/29/2012, 11:56 AM

## 2012-08-29 NOTE — H&P (Signed)
Agree with above note.  Kellie Clark H. 08/29/2012 11:52 AM

## 2012-08-29 NOTE — Anesthesia Postprocedure Evaluation (Signed)
  Anesthesia Post-op Note  Patient: Kellie Clark  Procedure(s) Performed: * No procedures listed *  Patient Location: PACU and Mother/Baby  Anesthesia Type:Epidural  Level of Consciousness: awake, alert , oriented and patient cooperative  Airway and Oxygen Therapy: Patient Spontanous Breathing  Post-op Pain: none  Post-op Assessment: Post-op Vital signs reviewed and Patient's Cardiovascular Status Stable  Post-op Vital Signs: Reviewed and stable  Complications: No apparent anesthesia complications

## 2012-08-30 MED ORDER — HYDROCHLOROTHIAZIDE 25 MG PO TABS: 25.0000 mg | ORAL_TABLET | Freq: Every day | ORAL | Status: DC

## 2012-08-30 MED ORDER — AMLODIPINE BESYLATE 10 MG PO TABS: 10.0000 mg | ORAL_TABLET | Freq: Every day | ORAL | Status: DC

## 2012-08-30 MED ORDER — DOCUSATE SODIUM 100 MG PO CAPS: 100.0000 mg | ORAL_CAPSULE | Freq: Two times a day (BID) | ORAL | Status: DC

## 2012-08-30 MED ORDER — IBUPROFEN 200 MG PO TABS: 600.0000 mg | ORAL_TABLET | Freq: Three times a day (TID) | ORAL | Status: DC | PRN

## 2012-08-30 NOTE — Discharge Summary (Signed)
Obstetric Discharge Summary Reason for Admission: onset of labor Prenatal Procedures: none Intrapartum Procedures: spontaneous vaginal delivery Postpartum Procedures: 24 hour Magnesium sulfate infusion Complications-Operative and Postpartum: 2nd degree perineal laceration Hemoglobin  Date Value Range Status  08/29/2012 7.1* 12.0 - 15.0 g/dL Final     HCT  Date Value Range Status  08/29/2012 22.9* 36.0 - 46.0 % Final    Physical Exam:  General: alert, cooperative and no distress Lochia: appropriate Uterine Fundus: firm Incision: none DVT Evaluation: No evidence of DVT seen on physical exam. Negative Homan's sign. No cords or calf tenderness. No significant calf/ankle edema.  Discharge Diagnoses: Term Pregnancy-delivered and Preelampsia  Discharge Information: Date: 08/30/2012 Activity: unrestricted Diet: routine Medications: PNV, Ibuprofen, Colace, Iron and Norvasc, HCTZ Condition: stable Instructions: refer to practice specific booklet Discharge to: home Contraception: Condoms, will follow-up in 2-3 months to decide BTL vs. IUD Bottle Feeding   Newborn Data: Live born female  Birth Weight: 7 lb 15.2 oz (3605 g) APGAR: 8, 9  Home with mother.  Dorrene German 08/30/2012, 7:29 AM  I have seen and examined this patient and agree the above assessment. CRESENZO-DISHMAN,Amandeep Nesmith 08/30/2012 7:45 AM

## 2012-09-03 ENCOUNTER — Emergency Department (HOSPITAL_COMMUNITY)
Admission: EM | Admit: 2012-09-03 | Discharge: 2012-09-03 | Disposition: A | Discharge status: Home / Self Care | Payer: Managed Care, Other (non HMO) | Attending: Emergency Medicine | Admitting: Emergency Medicine

## 2012-09-03 ENCOUNTER — Encounter (HOSPITAL_COMMUNITY): Payer: Self-pay | Admitting: *Deleted

## 2012-09-03 DIAGNOSIS — R112 Nausea with vomiting, unspecified: Secondary | ICD-10-CM | POA: Insufficient documentation

## 2012-09-03 DIAGNOSIS — D649 Anemia, unspecified: Secondary | ICD-10-CM | POA: Insufficient documentation

## 2012-09-03 DIAGNOSIS — Z79899 Other long term (current) drug therapy: Secondary | ICD-10-CM | POA: Insufficient documentation

## 2012-09-03 DIAGNOSIS — R197 Diarrhea, unspecified: Secondary | ICD-10-CM | POA: Insufficient documentation

## 2012-09-03 DIAGNOSIS — Z8719 Personal history of other diseases of the digestive system: Secondary | ICD-10-CM | POA: Insufficient documentation

## 2012-09-03 LAB — URINALYSIS, ROUTINE W REFLEX MICROSCOPIC
Nitrite: NEGATIVE
Protein, ur: NEGATIVE mg/dL
Specific Gravity, Urine: <1.005 — ABNORMAL LOW (ref 1.005–1.030)
Urobilinogen, UA: 0.2 mg/dL (ref 0.0–1.0)

## 2012-09-03 LAB — URINE MICROSCOPIC-ADD ON

## 2012-09-03 MED ORDER — ONDANSETRON 4 MG PO TBDP: 4.0000 mg | ORAL_TABLET | Freq: Three times a day (TID) | ORAL | Status: DC | PRN

## 2012-09-03 MED ORDER — MORPHINE SULFATE 4 MG/ML IJ SOLN
2.0000 mg | Freq: Once | INTRAMUSCULAR | Status: AC
  Administered 2012-09-03: 2 mg via INTRAVENOUS
  Filled 2012-09-03: qty 1

## 2012-09-03 MED ORDER — SODIUM CHLORIDE 0.9 % IV BOLUS (SEPSIS)
1000.0000 mL | Freq: Once | INTRAVENOUS | Status: AC
  Administered 2012-09-03: 1000 mL via INTRAVENOUS

## 2012-09-03 MED ORDER — ONDANSETRON HCL 4 MG/2ML IJ SOLN
4.0000 mg | Freq: Once | INTRAMUSCULAR | Status: AC
  Administered 2012-09-03: 4 mg via INTRAVENOUS
  Filled 2012-09-03: qty 2

## 2012-09-03 NOTE — ED Provider Notes (Signed)
History     CSN: [id number]  Arrival date & time 09/03/12  0531   First MD Initiated Contact with Patient 09/03/12 [phone]      Chief Complaint  Patient presents with  . Nausea  . Diarrhea    (Consider location/radiation/quality/duration/timing/severity/associated sxs/prior treatment) HPI Kellie Clark is a 27 y.o. female who presents to the Emergency Department complaining of  Nausea, vomiting and diarrhea since yesterday. She delivered a baby on 08/28/2012. Has been doing well until yesterday. Multiple episodes vomiting and diarrhea.  Past Medical History  Diagnosis Date  . Gallbladder disease     x2 weeks starting 1 week postpartum with G1  . Anemia     History reviewed. No pertinent past surgical history.  Family History  Problem Relation Age of Onset  . Cancer Paternal Grandmother     breast  . Hypertension Other   . Stroke Other   . Diabetes Other   . Heart disease Other     History  Substance Use Topics  . Smoking status: Never Smoker   . Smokeless tobacco: Not on file  . Alcohol Use: No    OB History   Grav Para Term Preterm Abortions TAB SAB Ect Mult Living   2 2 2       2       Review of Systems  Constitutional: Negative for fever.       10 Systems reviewed and are negative for acute change except as noted in the HPI.  HENT: Negative for congestion.   Eyes: Negative for discharge and redness.  Respiratory: Negative for cough and shortness of breath.   Cardiovascular: Negative for chest pain.  Gastrointestinal: Positive for nausea, vomiting and diarrhea. Negative for abdominal pain.  Musculoskeletal: Negative for back pain.  Skin: Negative for rash.  Neurological: Negative for syncope, numbness and headaches.  Psychiatric/Behavioral:       No behavior change.    Allergies  Ceclor  Home Medications   Current Outpatient Rx  Name  Route  Sig  Dispense  Refill  . amLODipine (NORVASC) 10 MG tablet   Oral   Take 1 tablet (10 mg total) by mouth  daily.   30 tablet   0   . docusate sodium (COLACE) 100 MG capsule   Oral   Take 1 capsule (100 mg total) by mouth 2 (two) times daily.   10 capsule   0   . hydrochlorothiazide (HYDRODIURIL) 25 MG tablet   Oral   Take 1 tablet (25 mg total) by mouth daily.   30 tablet   0   . ibuprofen (ADVIL) 200 MG tablet   Oral   Take 3 tablets (600 mg total) by mouth every 8 (eight) hours as needed for pain.   30 tablet   0   . IRON PO   Oral   Take 1 tablet by mouth daily.           BP 139/90  Pulse 98  Temp(Src) 98.2 F (36.8 C) (Oral)  Resp 20  Ht 5\' 4"  (1.626 m)  Wt 202 lb (91.627 kg)  BMI 34.66 kg/m2  SpO2 97%  LMP 11/17/2011  Physical Exam  Nursing note and vitals reviewed. Constitutional: She appears well-developed and well-nourished.  Awake, alert, nontoxic appearance.  HENT:  Head: Normocephalic and atraumatic.  Right Ear: External ear normal.  Left Ear: External ear normal.  Mouth/Throat: Oropharynx is clear and moist.  Neck: Neck supple.  Cardiovascular: Normal rate and intact distal pulses.  Pulmonary/Chest: Effort normal and breath sounds normal. She exhibits no tenderness.  Abdominal: Soft. Bowel sounds are normal. There is no tenderness. There is no rebound.  Musculoskeletal: She exhibits no tenderness.  Baseline ROM, no obvious new focal weakness.  Neurological:  Mental status and motor strength appears baseline for patient and situation.  Skin: No rash noted.  Psychiatric: She has a normal mood and affect.    ED Course  Procedures (including critical care time)  Results for orders placed during the hospital encounter of 09/03/12  URINALYSIS, ROUTINE W REFLEX MICROSCOPIC      Result Value Range   Color, Urine YELLOW  YELLOW   APPearance CLEAR  CLEAR   Specific Gravity, Urine <1.005 (*) 1.005 - 1.030   pH 6.0  5.0 - 8.0   Glucose, UA NEGATIVE  NEGATIVE mg/dL   Hgb urine dipstick MODERATE (*) NEGATIVE   Bilirubin Urine NEGATIVE  NEGATIVE    Ketones, ur NEGATIVE  NEGATIVE mg/dL   Protein, ur NEGATIVE  NEGATIVE mg/dL   Urobilinogen, UA 0.2  0.0 - 1.0 mg/dL   Nitrite NEGATIVE  NEGATIVE   Leukocytes, UA TRACE (*) NEGATIVE  URINE MICROSCOPIC-ADD ON      Result Value Range   Squamous Epithelial / LPF RARE  RARE   WBC, UA 3-6  <3 WBC/hpf      MDM  Patient with nausea, vomiting and diarrhea since yesterday. UA is negative. Has been given IVF, zofran and morphine. Feeling better. Pt stable in ED with no significant deterioration in condition.The patient appears reasonably screened and/or stabilized for discharge and I doubt any other medical condition or other [facility] requiring further screening, evaluation, or treatment in the ED at this time prior to discharge.  MDM Reviewed: nursing note and vitals Interpretation: labs           Nicoletta Dress. Colon Branch, MD 09/03/12 [phone]

## 2012-09-03 NOTE — ED Notes (Signed)
Patient with no complaints at this time. Respirations even and unlabored. Skin warm/dry. Discharge instructions reviewed with patient at this time. Patient given opportunity to voice concerns/ask questions. IV removed per policy and band-aid applied to site. Patient discharged at this time and left Emergency Department with steady gait.  

## 2012-09-03 NOTE — ED Notes (Addendum)
Pt c/o nausea and diarrhea since 11pm last night. Just had a baby on March 25th, 2014.

## 2012-10-03 ENCOUNTER — Ambulatory Visit: Payer: Medicaid Other | Admitting: Advanced Practice Midwife

## 2012-10-08 ENCOUNTER — Ambulatory Visit: Payer: Medicaid Other | Admitting: Obstetrics & Gynecology

## 2012-10-08 ENCOUNTER — Encounter: Payer: Self-pay | Admitting: *Deleted

## 2012-10-17 ENCOUNTER — Ambulatory Visit: Payer: Medicaid Other | Admitting: Obstetrics & Gynecology

## 2012-10-22 ENCOUNTER — Ambulatory Visit: Payer: Medicaid Other | Admitting: Women's Health

## 2012-10-23 ENCOUNTER — Ambulatory Visit (INDEPENDENT_AMBULATORY_CARE_PROVIDER_SITE_OTHER): Payer: Medicaid Other | Admitting: Women's Health

## 2012-10-23 ENCOUNTER — Encounter: Payer: Self-pay | Admitting: Women's Health

## 2012-10-23 VITALS — BP 130/90 | Ht 64.0 in | Wt 199.0 lb

## 2012-10-23 DIAGNOSIS — Z3480 Encounter for supervision of other normal pregnancy, unspecified trimester: Secondary | ICD-10-CM

## 2012-10-23 NOTE — Patient Instructions (Signed)
Sterilization Information, Female Female sterilization is a procedure to permanently prevent pregnancy. There are different ways to perform sterilization, but all either block or close the fallopian tubes so that your eggs cannot reach your uterus. If your egg cannot reach your uterus, sperm cannot fertilize the egg, and you cannot get pregnant.  Sterilization is performed by a surgical procedure. Sometimes these procedures are performed in a hospital while a patient is asleep. Sometimes they can be done in a clinic setting with the patient awake. The fallopian tubes can be surgically cut, tied, or sealed through a procedure called tubal ligation. The fallopian tubes can also be closed with clips or rings. Sterilization can also be done by placing a tiny coil into each fallopian tube, which causes scar tissue to grow inside the tube. The scar tissue then blocks the tubes.  Discuss sterilization with your caregiver to answer any concerns you or your partner may have. You may want to ask what type of sterilization your caregiver performs. Some caregivers may not perform all the various options. Sterilization is permanent and should only be done if you are sure you do not want children or do not want any more children. Having a sterilization reversed may not be successful.  STERILIZATION PROCEDURES  Laparoscopic sterilization. This is a surgical method performed at a time other than right after childbirth. Two incisions are made in the lower abdomen. A thin, lighted tube (laparoscope) is inserted into one of the incisions and is used to perform the procedure. The fallopian tubes are closed with a ring or a clip. An instrument that uses heat could be used to seal the tubes closed (electrocautery).   Mini-laparotomy. This is a surgical method done 1 or 2 days after giving birth. Typically, a small incision is made just below the belly button (umbilicus) and the fallopian tubes are exposed. The tubes can then be  sealed, tied, or cut.   Hysteroscopic sterilization. This is performed at a time other than right after childbirth. A tiny, spring-like coil is inserted through the cervix and uterus and placed into the fallopian tubes. The coil causes scaring and blocks the tubes. Other forms of contraception should be used for 3 months after the procedure to allow the scar tissue to form completely. Additionally, it is required hysterosalpingography be done 3 months later to ensure that the procedure was successful. Hysterosalpingography is a procedure that uses X-rays to look at your uterus and fallopian tubes after a material to make them show up better has been inserted. IS STERILIZATION SAFE? Sterilization is considered safe with very rare complications. Risks depend on the type of procedure you have. As with any surgical procedure, there are risks. Some risks of sterilization by any means include:   Bleeding.  Infection.  Reaction to anesthesia medicine.  Injury to surrounding organs. Risks specific to having hysteroscopic coils placed include:  The coils may not be placed correctly the first time.   The coils may move out of place.   The tubes may not get completely blocked after 3 months.   Injury to surrounding organs when placing the coil.  HOW EFFECTIVE IS FEMALE STERILIZATION? Sterilization is nearly 100% effective, but it can fail. Depending on the type of sterilization, the rate of failure can be as high as 3%. After hysteroscopic sterilization with placement of fallopian tube coils, you will need back-up birth control for 3 months after the procedure. Sterilization is effective for a lifetime.  BENEFITS OF STERILIZATION  It does   not affect your hormones, and therefore will not affect your menstrual periods, sexual desire, or performance.   It is effective for a lifetime.   It is safe.   You do not need to worry about getting pregnant. Keep in mind that if you had the  hysteroscopic placement procedure, you must wait 3 months after the procedure (or until your caregiver confirms) before pregnancy is not considered possible.   There are no side effects unlike other types of birth control (contraception).  DRAWBACKS OF STERILIZATION  You must be sure you do not want children or any more children. The procedure is permanent.   It does not provide protection against sexually transmitted infections (STIs).   The tubes can grow back together. If this happens, there is a risk of pregnancy. There is also an increased risk (50%) of pregnancy being an ectopic pregnancy. This is a pregnancy that happens outside of the uterus. Document Released: 11/09/2007 Document Revised: 11/22/2011 Document Reviewed: 09/08/2011 [employer] Patient Information 2013 [employer].  

## 2012-10-23 NOTE — Progress Notes (Signed)
Subjective:    Kellie Clark is a 27 y.o. G35P2002 African American female who presents for a postpartum visit. She is 8 weeks postpartum following a spontaneous vaginal delivery. I have fully reviewed the prenatal and intrapartum course. The delivery was at 40.5 gestational weeks. Outcome: spontaneous vaginal delivery. Anesthesia: epidural. She was admitted in spontaneous labor. A few hours postpartum she developed a headache and elevated bp's and received magnesium iv x 24hrs, was started on hctz 25mg  and norvasc 10mg . Postpartum course has been uneventful- she has been off of hctz and norvasc since end of april. Denies ha, scotomata, ruq/epigastric pain, n/v.  She had anemia throughout pregnancy and hgb was 7.9 upon admission and 7.1 upon d/c.  States she has been taking her iron supplementation daily as rx'd and denies dizziness, lightheadedness, or sob. Baby's course has been uneventful. Baby is feeding by bottle. Bleeding no bleeding. Bowel function is normal. Bladder function is normal. Patient is not sexually active. Contraception method is: requesting tubal ligation. R/B and permanency fully discussed, pt wishes to proceed w/ BTL. Postpartum depression screening: negative.  The following portions of the patient's history were reviewed and updated as appropriate: allergies, current medications, past medical history, past surgical history and problem list.  Review of Systems Pertinent items are noted in HPI.   Blood pressure 130/90, height 5\' 4"  (1.626 m), weight 199 lb (90.266 kg), last menstrual period 09/11/2012, not currently breastfeeding.  Objective:     General:  alert, cooperative and no distress   Breasts:  deferred, no complaints  Lungs: clear to auscultation bilaterally  Heart:  regular rate and rhythm  Abdomen: soft, nontender   Vulva: normal  Vagina: normal vagina  Cervix:  closed  Corpus: Well-involuted  Adnexa:  Non-palpable  Rectal Exam: No hemorrhoids         Assessment:   Normal postpartum exam 8 weeks s/p spontaneous vaginal delivery Depression screening Contraception counseling   Plan:   Contraception:desires tubal ligation Follow up in: 1 week for BTL discussion/pre-op to include CBC to assess anemia, BP recheck w/ MD

## 2012-11-02 ENCOUNTER — Ambulatory Visit: Payer: Medicaid Other | Admitting: Obstetrics and Gynecology

## 2013-09-24 ENCOUNTER — Encounter (HOSPITAL_COMMUNITY): Payer: Self-pay | Admitting: Emergency Medicine

## 2013-09-24 ENCOUNTER — Emergency Department (HOSPITAL_COMMUNITY)
Admission: EM | Admit: 2013-09-24 | Discharge: 2013-09-24 | Disposition: A | Discharge status: Home / Self Care | Payer: Managed Care, Other (non HMO) | Attending: Emergency Medicine | Admitting: Emergency Medicine

## 2013-09-24 DIAGNOSIS — R42 Dizziness and giddiness: Secondary | ICD-10-CM | POA: Insufficient documentation

## 2013-09-24 DIAGNOSIS — IMO0001 Reserved for inherently not codable concepts without codable children: Secondary | ICD-10-CM | POA: Insufficient documentation

## 2013-09-24 DIAGNOSIS — Z8719 Personal history of other diseases of the digestive system: Secondary | ICD-10-CM | POA: Insufficient documentation

## 2013-09-24 DIAGNOSIS — Z862 Personal history of diseases of the blood and blood-forming organs and certain disorders involving the immune mechanism: Secondary | ICD-10-CM | POA: Insufficient documentation

## 2013-09-24 DIAGNOSIS — Z79899 Other long term (current) drug therapy: Secondary | ICD-10-CM | POA: Insufficient documentation

## 2013-09-24 DIAGNOSIS — R112 Nausea with vomiting, unspecified: Secondary | ICD-10-CM | POA: Insufficient documentation

## 2013-09-24 DIAGNOSIS — J329 Chronic sinusitis, unspecified: Secondary | ICD-10-CM

## 2013-09-24 MED ORDER — OXYCODONE-ACETAMINOPHEN 5-325 MG PO TABS
1.0000 | ORAL_TABLET | Freq: Once | ORAL | Status: AC
  Administered 2013-09-24: 1 via ORAL
  Filled 2013-09-24: qty 1

## 2013-09-24 MED ORDER — AZITHROMYCIN 250 MG PO TABS: ORAL_TABLET | ORAL | Status: DC

## 2013-09-24 MED ORDER — ONDANSETRON 8 MG PO TBDP
8.0000 mg | ORAL_TABLET | Freq: Once | ORAL | Status: AC
  Administered 2013-09-24: 8 mg via ORAL
  Filled 2013-09-24: qty 1

## 2013-09-24 MED ORDER — IBUPROFEN 800 MG PO TABS
800.0000 mg | ORAL_TABLET | Freq: Once | ORAL | Status: AC
  Administered 2013-09-24: 800 mg via ORAL
  Filled 2013-09-24: qty 1

## 2013-09-24 MED ORDER — HYDROCODONE-ACETAMINOPHEN 5-325 MG PO TABS: ORAL_TABLET | ORAL | Status: DC

## 2013-09-24 NOTE — ED Provider Notes (Signed)
CSN: [id number]     Arrival date & time 09/24/13  1830 History   First MD Initiated Contact with Patient 09/24/13 1859     Chief Complaint  Patient presents with  . sinus pressure      (Consider location/radiation/quality/duration/timing/severity/associated sxs/prior Treatment) Patient is a 28 y.o. female presenting with headaches. The history is provided by the patient.  Headache Pain location:  Frontal Quality:  Dull Radiates to:  Does not radiate Onset quality:  Gradual Duration:  4 days Timing:  Intermittent Progression:  Worsening Chronicity:  New Similar to prior headaches: yes   Relieved by:  Nothing Exacerbated by: bending forward. Ineffective treatments: OTC cold and sinus medications. Associated symptoms: congestion, dizziness, eye pain, facial pain, myalgias, nausea and sinus pressure   Associated symptoms: no abdominal pain, no blurred vision, no cough, no ear pain, no fatigue, no fever, no focal weakness, no hearing loss, no near-syncope, no neck pain, no neck stiffness, no numbness, no paresthesias, no photophobia, no seizures, no sore throat, no swollen glands, no syncope, no tingling, no URI, no visual change, no vomiting and no weakness   Associated symptoms comment:  Pt reports pain to all her upper teeth that began on the day of ED arrival.   Congestion:    Location:  Nasal   Interferes with sleep: no     Interferes with eating/drinking: no   Myalgias:    Location:  Generalized   Quality:  Aching   Severity:  Mild   Timing:  Intermittent   Progression:  Waxing and waning Risk factors: no family hx of SAH and lifestyle not sedentary     Past Medical History  Diagnosis Date  . Gallbladder disease     x2 weeks starting 1 week postpartum with G1  . Anemia    History reviewed. No pertinent past surgical history. Family History  Problem Relation Age of Onset  . Cancer Paternal Grandmother     breast  . Hypertension Other   . Stroke Other   . Diabetes  Other   . Heart disease Other    History  Substance Use Topics  . Smoking status: Never Smoker   . Smokeless tobacco: Not on file  . Alcohol Use: No   OB History   Grav Para Term Preterm Abortions TAB SAB Ect Mult Living   2 2 2       2      Review of Systems  Constitutional: Negative for fever, activity change, appetite change and fatigue.  HENT: Positive for congestion and sinus pressure. Negative for ear pain, facial swelling, hearing loss, sore throat and trouble swallowing.   Eyes: Positive for pain. Negative for blurred vision, photophobia, redness, itching and visual disturbance.  Respiratory: Negative for cough and shortness of breath.   Cardiovascular: Negative for chest pain, syncope and near-syncope.  Gastrointestinal: Positive for nausea. Negative for vomiting and abdominal pain.  Genitourinary: Negative for dysuria and flank pain.  Musculoskeletal: Positive for myalgias. Negative for neck pain and neck stiffness.  Skin: Negative for rash and wound.  Neurological: Positive for dizziness and headaches. Negative for focal weakness, seizures, facial asymmetry, speech difficulty, weakness, numbness and paresthesias.  Psychiatric/Behavioral: Negative for confusion and decreased concentration.  All other systems reviewed and are negative.     Allergies  Ceclor  Home Medications   Prior to Admission medications   Medication Sig Start Date End Date Taking? Authorizing Provider  diphenhydrAMINE (BENADRYL) 25 MG tablet Take 25 mg by mouth daily as needed  for allergies.   Yes Historical Provider, MD  ibuprofen (ADVIL,MOTRIN) 200 MG tablet Take 400 mg by mouth every 6 (six) hours as needed.   Yes Historical Provider, MD   BP 120/83  Pulse 109  Temp(Src) 99.4 F (37.4 C) (Oral)  Resp 18  Ht 5\' 4"  (1.626 m)  Wt 205 lb (92.987 kg)  BMI 35.17 kg/m2  SpO2 100%  LMP 08/28/2013 Physical Exam  Nursing note and vitals reviewed. Constitutional: She is oriented to person,  place, and time. She appears well-developed and well-nourished. No distress.  HENT:  Head: Normocephalic and atraumatic.  Right Ear: Tympanic membrane and ear canal normal.  Left Ear: Tympanic membrane and ear canal normal.  Nose: Mucosal edema present. Right sinus exhibits maxillary sinus tenderness and frontal sinus tenderness. Left sinus exhibits maxillary sinus tenderness and frontal sinus tenderness.  Mouth/Throat: Uvula is midline. Mucous membranes are dry. No uvula swelling. No oropharyngeal exudate, posterior oropharyngeal edema or posterior oropharyngeal erythema.  Eyes: EOM are normal. Pupils are equal, round, and reactive to light.  Neck: Normal range of motion and phonation normal. Neck supple. No spinous process tenderness and no muscular tenderness present. No rigidity. No Brudzinski's sign and no Kernig's sign noted.  Cardiovascular: Normal rate, regular rhythm, normal heart sounds and intact distal pulses.   No murmur heard. Pulmonary/Chest: Effort normal and breath sounds normal. No respiratory distress. She has no wheezes. She has no rales.  Musculoskeletal: Normal range of motion.  Lymphadenopathy:    She has no cervical adenopathy.  Neurological: She is alert and oriented to person, place, and time. She has normal strength. No cranial nerve deficit or sensory deficit. She exhibits normal muscle tone. Coordination and gait normal. GCS eye subscore is 4. GCS verbal subscore is 5. GCS motor subscore is 6.  Reflex Scores:      Tricep reflexes are 2+ on the right side and 2+ on the left side.      Bicep reflexes are 2+ on the right side and 2+ on the left side. Skin: Skin is warm and dry.  Psychiatric: She has a normal mood and affect.    ED Course  Procedures (including critical care time) Labs Review Labs Reviewed - No data to display  Imaging Review No results found.   EKG Interpretation None      MDM   Final diagnoses:  Sinusitis   Patient was tachycardic  on arrival, but reports taking OTC cold medications and not drinking much fluids today.  Mucous membranes are dry.  Denies meningeal signs, chest pain or dyspnea.  Will address sx's and give oral fluids.   On recheck, patient is feeling much better. Reports her pain is now down to a "4".   Patient is well-appearing and nontoxic. Vital signs are also improved. Patient drank several cups of water without difficulty. No concerning symptoms for meningitis or SAH. Symptoms likely related to sinusitis. She agrees to zithromax, #8 vicodin and OTC zyrtec, and close f/u with her PMD.  She is talking with friend at bedside and stable for discharge.  Also advised to return if sx's worsen    Elissia Spiewak L. Trisha Mangleriplett, PA-C 09/26/13 1826

## 2013-09-24 NOTE — ED Notes (Signed)
Pt c/o headache and sinus pressure for past few days.  Reports today teeth hurt as well.   Reports took allergy medication 2 days ago and it helped but says today the medication hasn't helped.  Says the sinus pressure is making her dizzy.

## 2013-09-24 NOTE — Discharge Instructions (Signed)
Sinusitis Sinusitis is redness, soreness, and puffiness (inflammation) of the air pockets in the bones of your face (sinuses). The redness, soreness, and puffiness can cause air and mucus to get trapped in your sinuses. This can allow germs to grow and cause an infection.  HOME CARE   Drink enough fluids to keep your pee (urine) clear or pale yellow.  Use a humidifier in your home.  Run a hot shower to create steam in the bathroom. Sit in the bathroom with the door closed. Breathe in the steam 3 4 times a day.  Put a warm, moist washcloth on your face 3 4 times a day, or as told by your doctor.  Use salt water sprays (saline sprays) to wet the thick fluid in your nose. This can help the sinuses drain.  Only take medicine as told by your doctor. GET HELP RIGHT AWAY IF:   Your pain gets worse.  You have very bad headaches.  You are sick to your stomach (nauseous).  You throw up (vomit).  You are very sleepy (drowsy) all the time.  Your face is puffy (swollen).  Your vision changes.  You have a stiff neck.  You have trouble breathing. MAKE SURE YOU:   Understand these instructions.  Will watch your condition.  Will get help right away if you are not doing well or get worse. Document Released: 11/09/2007 Document Revised: 02/15/2012 Document Reviewed: 12/27/2011 ExitCare Patient Information 2014 [employer].  

## 2013-09-27 NOTE — ED Provider Notes (Signed)
Medical screening examination/treatment/procedure(s) were performed by non-physician practitioner and as supervising physician I was immediately available for consultation/collaboration.   EKG Interpretation None      Jacayla Nordell, MD, FACEP   Wing Gfeller L Candida Vetter, MD 09/27/13 1508 

## 2014-04-07 ENCOUNTER — Encounter (HOSPITAL_COMMUNITY): Payer: Self-pay | Admitting: Emergency Medicine

## 2014-12-17 ENCOUNTER — Emergency Department (HOSPITAL_COMMUNITY)
Admission: EM | Admit: 2014-12-17 | Discharge: 2014-12-17 | Disposition: A | Discharge status: Home / Self Care | Payer: Managed Care, Other (non HMO) | Attending: Emergency Medicine | Admitting: Emergency Medicine

## 2014-12-17 ENCOUNTER — Encounter (HOSPITAL_COMMUNITY): Payer: Self-pay | Admitting: *Deleted

## 2014-12-17 DIAGNOSIS — Z3202 Encounter for pregnancy test, result negative: Secondary | ICD-10-CM | POA: Insufficient documentation

## 2014-12-17 DIAGNOSIS — R112 Nausea with vomiting, unspecified: Secondary | ICD-10-CM | POA: Insufficient documentation

## 2014-12-17 DIAGNOSIS — Z8719 Personal history of other diseases of the digestive system: Secondary | ICD-10-CM | POA: Insufficient documentation

## 2014-12-17 DIAGNOSIS — R1013 Epigastric pain: Secondary | ICD-10-CM | POA: Insufficient documentation

## 2014-12-17 DIAGNOSIS — R079 Chest pain, unspecified: Secondary | ICD-10-CM | POA: Insufficient documentation

## 2014-12-17 DIAGNOSIS — Z862 Personal history of diseases of the blood and blood-forming organs and certain disorders involving the immune mechanism: Secondary | ICD-10-CM | POA: Insufficient documentation

## 2014-12-17 DIAGNOSIS — R197 Diarrhea, unspecified: Secondary | ICD-10-CM | POA: Insufficient documentation

## 2014-12-17 HISTORY — DX: Gastro-esophageal reflux disease without esophagitis: K21.9

## 2014-12-17 LAB — URINE MICROSCOPIC-ADD ON

## 2014-12-17 LAB — COMPREHENSIVE METABOLIC PANEL
ALBUMIN: 4.1 g/dL (ref 3.5–5.0)
ALK PHOS: 80 U/L (ref 38–126)
ALT: 15 U/L (ref 14–54)
AST: 17 U/L (ref 15–41)
Anion gap: 7 (ref 5–15)
BILIRUBIN TOTAL: 0.2 mg/dL — AB (ref 0.3–1.2)
BUN: 10 mg/dL (ref 6–20)
CO2: 23 mmol/L (ref 22–32)
CREATININE: 0.73 mg/dL (ref 0.44–1.00)
Calcium: 8.8 mg/dL — ABNORMAL LOW (ref 8.9–10.3)
Chloride: 105 mmol/L (ref 101–111)
GFR calc Af Amer: >60 mL/min (ref >60)
GLUCOSE: 117 mg/dL — AB (ref 65–99)
Potassium: 3.3 mmol/L — ABNORMAL LOW (ref 3.5–5.1)
SODIUM: 135 mmol/L (ref 135–145)
Total Protein: 9.7 g/dL — ABNORMAL HIGH (ref 6.5–8.1)

## 2014-12-17 LAB — CBC WITH DIFFERENTIAL/PLATELET
Basophils Absolute: 0 10*3/uL (ref 0.0–0.1)
Basophils Relative: 0 % (ref 0–1)
Eosinophils Absolute: 0.1 10*3/uL (ref 0.0–0.7)
Eosinophils Relative: 1 % (ref 0–5)
HCT: 35.7 % — ABNORMAL LOW (ref 36.0–46.0)
HEMOGLOBIN: 11.2 g/dL — AB (ref 12.0–15.0)
Lymphocytes Relative: 43 % (ref 12–46)
Lymphs Abs: 3.6 10*3/uL (ref 0.7–4.0)
MCH: 22.5 pg — ABNORMAL LOW (ref 26.0–34.0)
MCHC: 31.4 g/dL (ref 30.0–36.0)
MCV: 71.7 fL — AB (ref 78.0–100.0)
MONO ABS: 0.3 10*3/uL (ref 0.1–1.0)
MONOS PCT: 4 % (ref 3–12)
Neutro Abs: 4.4 10*3/uL (ref 1.7–7.7)
Neutrophils Relative %: 52 % (ref 43–77)
Platelets: 411 10*3/uL — ABNORMAL HIGH (ref 150–400)
RBC: 4.98 MIL/uL (ref 3.87–5.11)
RDW: 16.7 % — ABNORMAL HIGH (ref 11.5–15.5)
WBC: 8.5 10*3/uL (ref 4.0–10.5)

## 2014-12-17 LAB — URINALYSIS, ROUTINE W REFLEX MICROSCOPIC
Bilirubin Urine: NEGATIVE
GLUCOSE, UA: NEGATIVE mg/dL
Hgb urine dipstick: NEGATIVE
KETONES UR: NEGATIVE mg/dL
Nitrite: NEGATIVE
PH: 5.5 (ref 5.0–8.0)
Protein, ur: 100 mg/dL — AB
UROBILINOGEN UA: 0.2 mg/dL (ref 0.0–1.0)

## 2014-12-17 LAB — LIPASE, BLOOD: LIPASE: 24 U/L (ref 22–51)

## 2014-12-17 LAB — PREGNANCY, URINE: Preg Test, Ur: NEGATIVE

## 2014-12-17 MED ORDER — ONDANSETRON HCL 4 MG/2ML IJ SOLN
4.0000 mg | Freq: Once | INTRAMUSCULAR | Status: AC
  Administered 2014-12-17: 4 mg via INTRAVENOUS

## 2014-12-17 MED ORDER — ONDANSETRON HCL 4 MG/2ML IJ SOLN
4.0000 mg | Freq: Once | INTRAMUSCULAR | Status: DC
  Filled 2014-12-17: qty 2

## 2014-12-17 MED ORDER — SODIUM CHLORIDE 0.9 % IV BOLUS (SEPSIS)
1000.0000 mL | Freq: Once | INTRAVENOUS | Status: AC
  Administered 2014-12-17: 1000 mL via INTRAVENOUS

## 2014-12-17 MED ORDER — LOPERAMIDE HCL 2 MG PO CAPS: 2.0000 mg | ORAL_CAPSULE | Freq: Four times a day (QID) | ORAL | Status: DC | PRN

## 2014-12-17 MED ORDER — ONDANSETRON 4 MG PO TBDP: 4.0000 mg | ORAL_TABLET | Freq: Three times a day (TID) | ORAL | Status: DC | PRN

## 2014-12-17 NOTE — ED Notes (Signed)
Patient given ice water at this time. Patient is resting with no distress noted.

## 2014-12-17 NOTE — Discharge Instructions (Signed)
You were seen today for vomiting and diarrhea. This is likely an early viral illness. If you develop new or worsening abdominal pain or right arm pain, you may need to return for right upper quadrant ultrasound given your history of gallbladder issues. Your lab work is reassuring at this time.  Viral Gastroenteritis Viral gastroenteritis is also known as stomach flu. This condition affects the stomach and intestinal tract. It can cause sudden diarrhea and vomiting. The illness typically lasts 3 to 8 days. Most people develop an immune response that eventually gets rid of the virus. While this natural response develops, the virus can make you quite ill. CAUSES  Many different viruses can cause gastroenteritis, such as rotavirus or noroviruses. You can catch one of these viruses by consuming contaminated food or water. You may also catch a virus by sharing utensils or other personal items with an infected person or by touching a contaminated surface. SYMPTOMS  The most common symptoms are diarrhea and vomiting. These problems can cause a severe loss of body fluids (dehydration) and a body salt (electrolyte) imbalance. Other symptoms may include:  Fever.  Headache.  Fatigue.  Abdominal pain. DIAGNOSIS  Your caregiver can usually diagnose viral gastroenteritis based on your symptoms and a physical exam. A stool sample may also be taken to test for the presence of viruses or other infections. TREATMENT  This illness typically goes away on its own. Treatments are aimed at rehydration. The most serious cases of viral gastroenteritis involve vomiting so severely that you are not able to keep fluids down. In these cases, fluids must be given through an intravenous line (IV). HOME CARE INSTRUCTIONS   Drink enough fluids to keep your urine clear or pale yellow. Drink small amounts of fluids frequently and increase the amounts as tolerated.  Ask your caregiver for specific rehydration  instructions.  Avoid:  Foods high in sugar.  Alcohol.  Carbonated drinks.  Tobacco.  Juice.  Caffeine drinks.  Extremely hot or cold fluids.  Fatty, greasy foods.  Too much intake of anything at one time.  Dairy products until 24 to 48 hours after diarrhea stops.  You may consume probiotics. Probiotics are active cultures of beneficial bacteria. They may lessen the amount and number of diarrheal stools in adults. Probiotics can be found in yogurt with active cultures and in supplements.  Wash your hands well to avoid spreading the virus.  Only take over-the-counter or prescription medicines for pain, discomfort, or fever as directed by your caregiver. Do not give aspirin to children. Antidiarrheal medicines are not recommended.  Ask your caregiver if you should continue to take your regular prescribed and over-the-counter medicines.  Keep all follow-up appointments as directed by your caregiver. SEEK IMMEDIATE MEDICAL CARE IF:   You are unable to keep fluids down.  You do not urinate at least once every 6 to 8 hours.  You develop shortness of breath.  You notice blood in your stool or vomit. This may look like coffee grounds.  You have abdominal pain that increases or is concentrated in one small area (localized).  You have persistent vomiting or diarrhea.  You have a fever.  The patient is a child younger than 3 months, and he or she has a fever.  The patient is a child older than 3 months, and he or she has a fever and persistent symptoms.  The patient is a child older than 3 months, and he or she has a fever and symptoms suddenly get worse.  The patient is a baby, and he or she has no tears when crying. °MAKE SURE YOU:  °· Understand these instructions. °· Will watch your condition. °· Will get help right away if you are not doing well or get worse. °Document Released: 05/23/2005 Document Revised: 08/15/2011 Document Reviewed: 03/09/2011 °ExitCare® Patient  Information ©2015 [employer]. This information is not intended to replace advice given to you by your health care provider. Make sure you discuss any questions you have with your health care provider. ° °

## 2014-12-17 NOTE — ED Provider Notes (Signed)
CSN: [id number]     Arrival date & time 12/17/14  0144 History   First MD Initiated Contact with Patient 12/17/14 0150     Chief Complaint  Patient presents with  . Emesis     (Consider location/radiation/quality/duration/timing/severity/associated sxs/prior Treatment) HPI  This is a 29 year old female who presents with vomiting and diarrhea. Patient reports one-day history of diarrhea. She also reports chest pain that has been waxing and waning all day. The pain is actually in her upper abdomen epigastric region and right chest.  Patient reports the pain is sharp. She is currently pain-free. It is not related to food. She reports several episodes of nonbilious nonbloody emesis that began one hour prior to arrival. She denies any fevers or shortness of breath. Patient reports that she has a history of gallbladder disease but states that this feels somewhat different. Patient denies any sick contacts.  Past Medical History  Diagnosis Date  . Gallbladder disease     x2 weeks starting 1 week postpartum with G1  . Anemia   . GERD (gastroesophageal reflux disease)    History reviewed. No pertinent past surgical history. Family History  Problem Relation Age of Onset  . Cancer Paternal Grandmother     breast  . Hypertension Other   . Stroke Other   . Diabetes Other   . Heart disease Other    History  Substance Use Topics  . Smoking status: Never Smoker   . Smokeless tobacco: Not on file  . Alcohol Use: No   OB History    Gravida Para Term Preterm AB TAB SAB Ectopic Multiple Living   2 2 2       2      Review of Systems  Constitutional: Negative for fever.  Respiratory: Negative for chest tightness and shortness of breath.   Cardiovascular: Negative for chest pain.  Gastrointestinal: Positive for nausea, vomiting, abdominal pain and diarrhea.  Genitourinary: Negative for dysuria.  Musculoskeletal: Negative for back pain.  Neurological: Negative for headaches.  All other  systems reviewed and are negative.     Allergies  Ceclor  Home Medications   Prior to Admission medications   Medication Sig Start Date End Date Taking? Authorizing Provider  azithromycin (ZITHROMAX Z-PAK) 250 MG tablet Take two tablets on day one, then one tab qd days 2-5 09/24/13   Tammy Triplett, PA-C  diphenhydrAMINE (BENADRYL) 25 MG tablet Take 25 mg by mouth daily as needed for allergies.    Historical Provider, MD  HYDROcodone-acetaminophen (NORCO/VICODIN) 5-325 MG per tablet Take one-two tabs po q 4-6 hrs prn pain 09/24/13   Tammy Triplett, PA-C  ibuprofen (ADVIL,MOTRIN) 200 MG tablet Take 400 mg by mouth every 6 (six) hours as needed.    Historical Provider, MD  loperamide (IMODIUM) 2 MG capsule Take 1 capsule (2 mg total) by mouth 4 (four) times daily as needed for diarrhea or loose stools. 12/17/14   Shon Batonourtney F Takita Riecke, MD  ondansetron (ZOFRAN ODT) 4 MG disintegrating tablet Take 1 tablet (4 mg total) by mouth every 8 (eight) hours as needed for nausea or vomiting. 12/17/14   Shon Batonourtney F Milicent Acheampong, MD   BP 110/75 mmHg  Pulse 83  Temp(Src) 98 F (36.7 C)  Resp 11  Ht 5\' 4"  (1.626 m)  Wt 220 lb (99.791 kg)  BMI 37.74 kg/m2  SpO2 100%  LMP 12/14/2014 Physical Exam  Constitutional: She is oriented to person, place, and time. She appears well-developed and well-nourished.  HENT:  Head: Normocephalic and atraumatic.  Mouth/Throat: Oropharynx is clear and moist.  Eyes: Pupils are equal, round, and reactive to light.  Cardiovascular: Normal rate, regular rhythm and normal heart sounds.   No murmur heard. Pulmonary/Chest: Effort normal and breath sounds normal. No respiratory distress. She has no wheezes.  Abdominal: Soft. Bowel sounds are normal. There is tenderness. There is no rebound and no guarding.  Mild epigastric tenderness to palpation without rebound or guarding  Neurological: She is alert and oriented to person, place, and time.  Skin: Skin is warm and dry.  Psychiatric:  She has a normal mood and affect.  Nursing note and vitals reviewed.   ED Course  Procedures (including critical care time) Labs Review Labs Reviewed  URINALYSIS, ROUTINE W REFLEX MICROSCOPIC (NOT AT [facility]) - Abnormal; Notable for the following:    APPearance HAZY (*)    Specific Gravity, Urine >1.030 (*)    Protein, ur 100 (*)    Leukocytes, UA TRACE (*)    All other components within normal limits  CBC WITH DIFFERENTIAL/PLATELET - Abnormal; Notable for the following:    Hemoglobin 11.2 (*)    HCT 35.7 (*)    MCV 71.7 (*)    MCH 22.5 (*)    RDW 16.7 (*)    Platelets 411 (*)    All other components within normal limits  COMPREHENSIVE METABOLIC PANEL - Abnormal; Notable for the following:    Potassium 3.3 (*)    Glucose, Bld 117 (*)    Calcium 8.8 (*)    Total Protein 9.7 (*)    Total Bilirubin 0.2 (*)    All other components within normal limits  URINE MICROSCOPIC-ADD ON - Abnormal; Notable for the following:    Squamous Epithelial / LPF MANY (*)    Bacteria, UA MANY (*)    All other components within normal limits  PREGNANCY, URINE  LIPASE, BLOOD    Imaging Review No results found.   EKG Interpretation   Date/Time:  Wednesday December 17 2014 01:58:02 EDT Ventricular Rate:  102 PR Interval:  153 QRS Duration: 73 QT Interval:  327 QTC Calculation: 426 R Axis:   50 Text Interpretation:  Sinus tachycardia Borderline T abnormalities,  anterior leads no prior for comparison Confirmed by Sheelah Ritacco  MD, Johndavid Geralds  ([id number]) on 12/17/2014 2:55:15 AM      MDM   Final diagnoses:  Nausea vomiting and diarrhea    Patient presents with nausea, vomiting, and diarrhea. Also reports waxing and waning chest pain. However, on exam, pain is more in her epigastrium. She is currently pain-free. Screening EKG is reassuring. She does have a history of gallstones. She has minimal tenderness at this time. Basic labwork obtained. Lab work is reassuring including LFTs and lipase. Patient was  given fluids and Zofran and reports improvement of her symptoms. Given relatively benign exam, suspect this may be early gastroenteritis. However, if patient has persistent or worsening epigastric or right upper quadrant pain, she may need to return for right upper quadrant ultrasound. At this time given reassuring lab work, I feel she is safe for discharge home. She is able to tolerate fluids prior to discharge.  After history, exam, and medical workup I feel the patient has been appropriately medically screened and is safe for discharge home. Pertinent diagnoses were discussed with the patient. Patient was given return precautions.     Shon Baton, MD 12/17/14 [phone]

## 2014-12-17 NOTE — ED Notes (Signed)
Pt reports vomiting that started about hour ago. Says has had chest pains on & off all day.

## 2014-12-17 NOTE — ED Notes (Signed)
Pt alert & oriented x4, stable gait. Patient given discharge instructions, paperwork & prescription(s). Patient  instructed to stop at the registration desk to finish any additional paperwork. Patient verbalized understanding. Pt left department w/ no further questions. 

## 2015-03-17 ENCOUNTER — Emergency Department (HOSPITAL_COMMUNITY)
Admission: EM | Admit: 2015-03-17 | Discharge: 2015-03-17 | Disposition: A | Discharge status: Home / Self Care | Payer: Self-pay | Attending: Emergency Medicine | Admitting: Emergency Medicine

## 2015-03-17 ENCOUNTER — Emergency Department (HOSPITAL_COMMUNITY): Payer: Managed Care, Other (non HMO)

## 2015-03-17 ENCOUNTER — Encounter (HOSPITAL_COMMUNITY): Payer: Self-pay | Admitting: *Deleted

## 2015-03-17 DIAGNOSIS — Z3202 Encounter for pregnancy test, result negative: Secondary | ICD-10-CM | POA: Insufficient documentation

## 2015-03-17 DIAGNOSIS — D509 Iron deficiency anemia, unspecified: Secondary | ICD-10-CM

## 2015-03-17 DIAGNOSIS — D508 Other iron deficiency anemias: Secondary | ICD-10-CM | POA: Insufficient documentation

## 2015-03-17 DIAGNOSIS — R112 Nausea with vomiting, unspecified: Secondary | ICD-10-CM | POA: Insufficient documentation

## 2015-03-17 DIAGNOSIS — R1013 Epigastric pain: Secondary | ICD-10-CM | POA: Insufficient documentation

## 2015-03-17 DIAGNOSIS — R0981 Nasal congestion: Secondary | ICD-10-CM | POA: Insufficient documentation

## 2015-03-17 DIAGNOSIS — Z8719 Personal history of other diseases of the digestive system: Secondary | ICD-10-CM | POA: Insufficient documentation

## 2015-03-17 LAB — URINALYSIS, ROUTINE W REFLEX MICROSCOPIC
BILIRUBIN URINE: NEGATIVE
Glucose, UA: NEGATIVE mg/dL
KETONES UR: NEGATIVE mg/dL
Leukocytes, UA: NEGATIVE
Nitrite: NEGATIVE
Specific Gravity, Urine: 1.025 (ref 1.005–1.030)
UROBILINOGEN UA: 0.2 mg/dL (ref 0.0–1.0)
pH: 6 (ref 5.0–8.0)

## 2015-03-17 LAB — CBC WITH DIFFERENTIAL/PLATELET
BASOS ABS: 0 10*3/uL (ref 0.0–0.1)
BASOS PCT: 0 %
EOS PCT: 1 %
Eosinophils Absolute: 0.1 10*3/uL (ref 0.0–0.7)
HEMATOCRIT: 36.8 % (ref 36.0–46.0)
Hemoglobin: 11.7 g/dL — ABNORMAL LOW (ref 12.0–15.0)
LYMPHS PCT: 26 %
Lymphs Abs: 2.4 10*3/uL (ref 0.7–4.0)
MCH: 23.4 pg — ABNORMAL LOW (ref 26.0–34.0)
MCHC: 31.8 g/dL (ref 30.0–36.0)
MCV: 73.5 fL — AB (ref 78.0–100.0)
Monocytes Absolute: 0.3 10*3/uL (ref 0.1–1.0)
Monocytes Relative: 3 %
NEUTROS ABS: 6.5 10*3/uL (ref 1.7–7.7)
Neutrophils Relative %: 70 %
PLATELETS: 403 10*3/uL — AB (ref 150–400)
RBC: 5.01 MIL/uL (ref 3.87–5.11)
RDW: 16.9 % — ABNORMAL HIGH (ref 11.5–15.5)
WBC: 9.3 10*3/uL (ref 4.0–10.5)

## 2015-03-17 LAB — URINE MICROSCOPIC-ADD ON

## 2015-03-17 LAB — COMPREHENSIVE METABOLIC PANEL
ALT: 16 U/L (ref 14–54)
AST: 16 U/L (ref 15–41)
Albumin: 3.9 g/dL (ref 3.5–5.0)
Alkaline Phosphatase: 76 U/L (ref 38–126)
Anion gap: 8 (ref 5–15)
BUN: 12 mg/dL (ref 6–20)
CHLORIDE: 102 mmol/L (ref 101–111)
CO2: 24 mmol/L (ref 22–32)
Calcium: 8.5 mg/dL — ABNORMAL LOW (ref 8.9–10.3)
Creatinine, Ser: 0.83 mg/dL (ref 0.44–1.00)
Glucose, Bld: 110 mg/dL — ABNORMAL HIGH (ref 65–99)
POTASSIUM: 3.6 mmol/L (ref 3.5–5.1)
Sodium: 134 mmol/L — ABNORMAL LOW (ref 135–145)
TOTAL PROTEIN: 9.4 g/dL — AB (ref 6.5–8.1)
Total Bilirubin: 0.3 mg/dL (ref 0.3–1.2)

## 2015-03-17 LAB — LIPASE, BLOOD: LIPASE: 25 U/L (ref 22–51)

## 2015-03-17 LAB — PREGNANCY, URINE: PREG TEST UR: NEGATIVE

## 2015-03-17 MED ORDER — MORPHINE SULFATE (PF) 4 MG/ML IV SOLN
4.0000 mg | Freq: Once | INTRAVENOUS | Status: AC
  Administered 2015-03-17: 4 mg via INTRAVENOUS
  Filled 2015-03-17: qty 1

## 2015-03-17 MED ORDER — GI COCKTAIL ~~LOC~~
30.0000 mL | Freq: Once | ORAL | Status: AC
  Administered 2015-03-17: 30 mL via ORAL
  Filled 2015-03-17: qty 30

## 2015-03-17 MED ORDER — PANTOPRAZOLE SODIUM 40 MG PO TBEC
40.0000 mg | DELAYED_RELEASE_TABLET | Freq: Once | ORAL | Status: AC
  Administered 2015-03-17: 40 mg via ORAL
  Filled 2015-03-17: qty 1

## 2015-03-17 MED ORDER — SODIUM CHLORIDE 0.9 % IV SOLN: 1000.0000 mL | INTRAVENOUS | Status: DC

## 2015-03-17 MED ORDER — SODIUM CHLORIDE 0.9 % IV SOLN
1000.0000 mL | Freq: Once | INTRAVENOUS | Status: AC
  Administered 2015-03-17: 1000 mL via INTRAVENOUS

## 2015-03-17 MED ORDER — ONDANSETRON HCL 4 MG/2ML IJ SOLN
4.0000 mg | Freq: Once | INTRAMUSCULAR | Status: AC
  Administered 2015-03-17: 4 mg via INTRAMUSCULAR
  Filled 2015-03-17: qty 2

## 2015-03-17 NOTE — ED Provider Notes (Signed)
CSN: [id number]     Arrival date & time 03/17/15  0018 History   First MD Initiated Contact with Patient 03/17/15 0111     Chief Complaint  Patient presents with  . Abdominal Pain     (Consider location/radiation/quality/duration/timing/severity/associated sxs/prior Treatment) HPI Comments: Patient states that one of her children and her significant other have had some mild to moderate upper respiratory symptoms. The patient states her family however did not have as many symptoms that she has had. She denies any recent changes in diet. She has not been out of the country recently. His been no recent changes in medication. It is of note that she was told that she had some "gallbladder issues" at the time of the birth of her child. She states that she occasionally has some heartburn or some mild epigastric area pain, but has not had any recently until now.  Patient is a 29 y.o. female presenting with abdominal pain. The history is provided by the patient.  Abdominal Pain Pain location:  Epigastric Pain radiates to:  Back Pain severity:  Moderate Onset quality:  Sudden Timing:  Intermittent Progression:  Worsening Chronicity:  Recurrent Context: sick contacts   Context comment:  Uri Relieved by:  Nothing Worsened by:  Nothing tried Ineffective treatments:  None tried Associated symptoms: nausea and vomiting   Associated symptoms: no fever and no hematuria   Risk factors: no alcohol abuse and no recent hospitalization     Past Medical History  Diagnosis Date  . Gallbladder disease     x2 weeks starting 1 week postpartum with G1  . Anemia   . GERD (gastroesophageal reflux disease)    History reviewed. No pertinent past surgical history. Family History  Problem Relation Age of Onset  . Cancer Paternal Grandmother     breast  . Hypertension Other   . Stroke Other   . Diabetes Other   . Heart disease Other    Social History  Substance Use Topics  . Smoking status: Never  Smoker   . Smokeless tobacco: None  . Alcohol Use: No   OB History    Gravida Para Term Preterm AB TAB SAB Ectopic Multiple Living   Review of Systems  Constitutional: Negative for fever.  HENT: Positive for congestion and postnasal drip.   Gastrointestinal: Positive for nausea, vomiting and abdominal pain.  Genitourinary: Negative for hematuria.  All other systems reviewed and are negative.     Allergies  Ceclor  Home Medications   Prior to Admission medications   Medication Sig Start Date End Date Taking? Authorizing Provider  azithromycin (ZITHROMAX Z-PAK) 250 MG tablet Take two tablets on day one, then one tab qd days 2-5 09/24/13   Tammy Triplett, PA-C  diphenhydrAMINE (BENADRYL) 25 MG tablet Take 25 mg by mouth daily as needed for allergies.    Historical Provider, MD  HYDROcodone-acetaminophen (NORCO/VICODIN) 5-325 MG per tablet Take one-two tabs po q 4-6 hrs prn pain 09/24/13   Tammy Triplett, PA-C  ibuprofen (ADVIL,MOTRIN) 200 MG tablet Take 400 mg by mouth every 6 (six) hours as needed.    Historical Provider, MD  loperamide (IMODIUM) 2 MG capsule Take 1 capsule (2 mg total) by mouth 4 (four) times daily as needed for diarrhea or loose stools. 12/17/14   Shon Baton, MD  ondansetron (ZOFRAN ODT) 4 MG disintegrating tablet Take 1 tablet (4 mg total) by mouth every 8 (eight)  hours as needed for nausea or vomiting. 12/17/14   Shon Baton, MD   BP 108/77 mmHg  Pulse 98  Temp(Src) 98.7 F (37.1 C) (Oral)  Resp 18  Ht  (1.626 m)  Wt 210 lb (95.255 kg)  BMI 36.03 kg/m2  SpO2 100%  LMP 03/17/2015 Physical Exam  Constitutional: She is oriented to person, place, and time. She appears well-developed and well-nourished.  Non-toxic appearance.  HENT:  Head: Normocephalic.  Right Ear: Tympanic membrane and external ear normal.  Left Ear: Tympanic membrane and external ear normal.  Nasal congestion present.  The oropharynx is clear.  Eyes:  EOM and lids are normal. Pupils are equal, round, and reactive to light.  Neck: Normal range of motion. Neck supple. Carotid bruit is not present.  Cardiovascular: Normal rate, regular rhythm, normal heart sounds, intact distal pulses and normal pulses.   Pulmonary/Chest: Breath sounds normal. No respiratory distress.  Abdominal: Soft. Bowel sounds are normal. There is no tenderness. There is no guarding.  Mild left upper quadrant pain to palpation. No abdominal pain with straight leg raise, or with flexing of psoas by change of position. No distention. No mass, and no pulsating mass.  Musculoskeletal: Normal range of motion.  Lymphadenopathy:       Head (right side): No submandibular adenopathy present.       Head (left side): No submandibular adenopathy present.    She has no cervical adenopathy.  Neurological: She is alert and oriented to person, place, and time. She has normal strength. No cranial nerve deficit or sensory deficit.  Skin: Skin is warm and dry. No rash noted.  Psychiatric: She has a normal mood and affect. Her speech is normal.  Nursing note and vitals reviewed.   ED Course Patient's care continued by Dr. Lauretta Chester AM.   Procedures (including critical care time) Labs Review Labs Reviewed  URINALYSIS, ROUTINE W REFLEX MICROSCOPIC (NOT AT [facility])  PREGNANCY, URINE  COMPREHENSIVE METABOLIC PANEL  LIPASE, BLOOD  CBC WITH DIFFERENTIAL/PLATELET    Imaging Review No results found. I have personally reviewed and evaluated these images and lab results as part of my medical decision-making.   EKG Interpretation None      MDM  Vital signs reviewed. Pulse oximetry 100% on room air. Complete blood count nonacute.  Eval for UTI, Pregnancy, occult pneumonia,  Gallbladder disease, and pancrease issues or  Musculoskeletal pain. Cbc wnl. Lipase wnl.  Pt's care to be continued by Dr Preston Fleeting.   Final diagnoses:  None    **I have reviewed nursing notes, vital signs, and all  appropriate lab and imaging results for this patient.[name], PA-C 03/17/15 1554  Dione Booze, MD 03/23/15 1610  Dione Booze, MD 03/23/15 [phone]

## 2015-03-17 NOTE — Discharge Instructions (Signed)
Take omeprazole (Prilosec OTC) once a day. Return if pain is getting worse.   Abdominal Pain, Adult Many things can cause abdominal pain. Usually, abdominal pain is not caused by a disease and will improve without treatment. It can often be observed and treated at home. Your health care provider will do a physical exam and possibly order blood tests and X-rays to help determine the seriousness of your pain. However, in many cases, more time must pass before a clear cause of the pain can be found. Before that point, your health care provider may not know if you need more testing or further treatment. HOME CARE INSTRUCTIONS Monitor your abdominal pain for any changes. The following actions may help to alleviate any discomfort you are experiencing:  Only take over-the-counter or prescription medicines as directed by your health care provider.  Do not take laxatives unless directed to do so by your health care provider.  Try a clear liquid diet (broth, tea, or water) as directed by your health care provider. Slowly move to a bland diet as tolerated. SEEK MEDICAL CARE IF:  You have unexplained abdominal pain.  You have abdominal pain associated with nausea or diarrhea.  You have pain when you urinate or have a bowel movement.  You experience abdominal pain that wakes you in the night.  You have abdominal pain that is worsened or improved by eating food.  You have abdominal pain that is worsened with eating fatty foods.  You have a fever. SEEK IMMEDIATE MEDICAL CARE IF:  Your pain does not go away within 2 hours.  You keep throwing up (vomiting).  Your pain is felt only in portions of the abdomen, such as the right side or the left lower portion of the abdomen.  You pass bloody or black tarry stools. MAKE SURE YOU:  Understand these instructions.  Will watch your condition.  Will get help right away if you are not doing well or get worse.   This information is not intended to  replace advice given to you by your health care provider. Make sure you discuss any questions you have with your health care provider.   Document Released: 03/02/2005 Document Revised: 02/11/2015 Document Reviewed: 01/30/2013 Elsevier Interactive Patient Education [employer]

## 2015-03-17 NOTE — ED Notes (Signed)
   03/17/15 0044  Abdominal  Gastrointestinal (WDL) X  Abdomen Inspection Soft  RUQ Bowel Sounds Audible  LUQ Bowel Sounds Audible  RLQ Bowel Sounds Audible  LLQ Bowel Sounds Audible  Tenderness Tender  Passing Flatus Yes  Last Bowel Movement Date 03/17/15  Pt c/o n/v/d since eating soup at 2000 tonight. Pt states she has gallbladder flare-ups at times.

## 2015-03-17 NOTE — ED Notes (Signed)
Pt reporting N/V/D starting tonight about 8pm after eating soup.  Pt also reporting pain in epigastric area.  Denies fever.

## 2018-05-07 ENCOUNTER — Encounter (HOSPITAL_COMMUNITY): Payer: Self-pay | Admitting: Emergency Medicine

## 2018-05-07 ENCOUNTER — Other Ambulatory Visit: Payer: Self-pay

## 2018-05-07 ENCOUNTER — Emergency Department (HOSPITAL_COMMUNITY)
Admission: EM | Admit: 2018-05-07 | Discharge: 2018-05-07 | Disposition: A | Discharge status: Home / Self Care | Payer: Managed Care, Other (non HMO) | Attending: Emergency Medicine | Admitting: Emergency Medicine

## 2018-05-07 ENCOUNTER — Emergency Department (HOSPITAL_COMMUNITY): Payer: Managed Care, Other (non HMO)

## 2018-05-07 DIAGNOSIS — R0789 Other chest pain: Secondary | ICD-10-CM | POA: Insufficient documentation

## 2018-05-07 DIAGNOSIS — R05 Cough: Secondary | ICD-10-CM | POA: Diagnosis present

## 2018-05-07 DIAGNOSIS — E876 Hypokalemia: Secondary | ICD-10-CM | POA: Diagnosis not present

## 2018-05-07 DIAGNOSIS — N179 Acute kidney failure, unspecified: Secondary | ICD-10-CM | POA: Diagnosis not present

## 2018-05-07 DIAGNOSIS — J209 Acute bronchitis, unspecified: Secondary | ICD-10-CM | POA: Diagnosis not present

## 2018-05-07 LAB — I-STAT BETA HCG BLOOD, ED (MC, WL, AP ONLY): I-stat hCG, quantitative: 6.1 m[IU]/mL — ABNORMAL HIGH (ref <5)

## 2018-05-07 LAB — I-STAT CHEM 8, ED
BUN: 25 mg/dL — AB (ref 6–20)
CREATININE: 1.5 mg/dL — AB (ref 0.44–1.00)
Calcium, Ion: 1.12 mmol/L — ABNORMAL LOW (ref 1.15–1.40)
Chloride: 98 mmol/L (ref 98–111)
Glucose, Bld: 126 mg/dL — ABNORMAL HIGH (ref 70–99)
HEMATOCRIT: 56 % — AB (ref 36.0–46.0)
Hemoglobin: 19 g/dL — ABNORMAL HIGH (ref 12.0–15.0)
POTASSIUM: 2.9 mmol/L — AB (ref 3.5–5.1)
SODIUM: 142 mmol/L (ref 135–145)
TCO2: 32 mmol/L (ref 22–32)

## 2018-05-07 LAB — I-STAT TROPONIN, ED: Troponin i, poc: 0.08 ng/mL (ref 0.00–0.08)

## 2018-05-07 MED ORDER — SODIUM CHLORIDE 0.9 % IV BOLUS
1000.0000 mL | Freq: Once | INTRAVENOUS | Status: AC
  Administered 2018-05-07: 1000 mL via INTRAVENOUS

## 2018-05-07 MED ORDER — POTASSIUM CHLORIDE 10 MEQ/100ML IV SOLN
10.0000 meq | Freq: Once | INTRAVENOUS | Status: AC
  Administered 2018-05-07: 10 meq via INTRAVENOUS

## 2018-05-07 MED ORDER — POTASSIUM CHLORIDE CRYS ER 20 MEQ PO TBCR
40.0000 meq | EXTENDED_RELEASE_TABLET | Freq: Once | ORAL | Status: AC
  Administered 2018-05-07: 40 meq via ORAL
  Filled 2018-05-07: qty 2

## 2018-05-07 MED ORDER — POTASSIUM CHLORIDE CRYS ER 20 MEQ PO TBCR: 40.0000 meq | EXTENDED_RELEASE_TABLET | Freq: Every day | ORAL | 0 refills | Status: DC

## 2018-05-07 MED ORDER — ONDANSETRON 4 MG PO TBDP: ORAL_TABLET | ORAL | 0 refills | Status: DC

## 2018-05-07 NOTE — Discharge Instructions (Addendum)
Stay well-hydrated.  Follow-up with local doctor for repeat blood work and recheck in 2 to 3 days.    If you were given medicines take as directed.  If you are on coumadin or contraceptives realize their levels and effectiveness is altered by many different medicines.  If you have any reaction (rash, tongues swelling, other) to the medicines stop taking and see a physician.    If your blood pressure was elevated in the ER make sure you follow up for management with a primary doctor or return for chest pain, shortness of breath or stroke symptoms.  Please follow up as directed and return to the ER or see a physician for new or worsening symptoms.  Thank you. Vitals:   05/07/18 0732 05/07/18 0735  BP:  133/88  Pulse:  73  Resp:  (!) 22  Temp:  97.9 F (36.6 C)  TempSrc:  Oral  SpO2:  100%  Weight: 89.8 kg   Height: 5\' 4"  (1.626 m)

## 2018-05-07 NOTE — ED Notes (Signed)
No IV potassium in Pyxis. Message sent to pharmacy.

## 2018-05-07 NOTE — ED Provider Notes (Signed)
[facility] EMERGENCY DEPARTMENT Provider Note   CSN: [id number] Arrival date & time: 05/07/18  4098     History   Chief Complaint Chief Complaint  Patient presents with  . Cough    HPI LITA FLYNN is a 32 y.o. female.  Patient with history of gallbladder disease, anemia presents with intermittent cough, congestion, lightheadedness and chest soreness with coughing for the past week.  No significant sick contacts.  Vaccines up-to-date.  Lightheadedness worse with standing.     Past Medical History:  Diagnosis Date  . Anemia   . Gallbladder disease    x2 weeks starting 1 week postpartum with G1  . GERD (gastroesophageal reflux disease)     There are no active problems to display for this patient.   History reviewed. No pertinent surgical history.   OB History    Gravida  2   Para  2   Term  2   Preterm      AB      Living  2     SAB      TAB      Ectopic      Multiple      Live Births  2            Home Medications    Prior to Admission medications   Medication Sig Start Date End Date Taking? Authorizing Provider  diphenhydrAMINE (BENADRYL) 25 MG tablet Take 25 mg by mouth daily as needed for allergies.    [provider]  ibuprofen (ADVIL,MOTRIN) 200 MG tablet Take 400 mg by mouth every 6 (six) hours as needed.    [provider]  loperamide (IMODIUM) 2 MG capsule Take 1 capsule (2 mg total) by mouth 4 (four) times daily as needed for diarrhea or loose stools. 12/17/14   Shon Baton, MD  ondansetron (ZOFRAN ODT) 4 MG disintegrating tablet 4mg  ODT q4 hours prn nausea/vomit 05/07/18   Blane Ohara, MD  potassium chloride SA (K-DUR,KLOR-CON) 20 MEQ tablet Take 2 tablets (40 mEq total) by mouth daily for 3 days. 05/07/18 05/10/18  Blane Ohara, MD    Family History Family History  Problem Relation Age of Onset  . Hypertension Other   . Stroke Other   . Diabetes Other   . Heart disease Other   . Cancer  Paternal Grandmother        breast    Social History Social History   Tobacco Use  . Smoking status: Never Smoker  . Smokeless tobacco: Never Used  Substance Use Topics  . Alcohol use: No  . Drug use: No     Allergies   Ceclor [cefaclor]   Review of Systems Review of Systems  Constitutional: Negative for chills and fever.  HENT: Positive for congestion.   Eyes: Negative for visual disturbance.  Respiratory: Positive for cough. Negative for shortness of breath.   Cardiovascular: Positive for chest pain. Negative for leg swelling.  Gastrointestinal: Negative for abdominal pain and vomiting.  Genitourinary: Negative for dysuria and flank pain.  Musculoskeletal: Negative for back pain, neck pain and neck stiffness.  Skin: Negative for rash.  Neurological: Positive for light-headedness. Negative for headaches.     Physical Exam Updated Vital Signs BP 112/73   Pulse 69   Temp 97.9 F (36.6 C) (Oral)   Resp 17   Ht 5\' 4"  (1.626 m)   Wt 89.8 kg   LMP 04/12/2018 (Approximate)   SpO2 100%   BMI 33.99 kg/m  Physical Exam  Constitutional: She is oriented to person, place, and time. She appears well-developed and well-nourished.  HENT:  Head: Normocephalic and atraumatic.  Dry mucous membrane  Eyes: Conjunctivae are normal. Right eye exhibits no discharge. Left eye exhibits no discharge.  Neck: Normal range of motion. Neck supple. No tracheal deviation present.  Cardiovascular: Normal rate and regular rhythm.  Pulmonary/Chest: Effort normal and breath sounds normal.  Abdominal: Soft. She exhibits no distension. There is no tenderness. There is no guarding.  Musculoskeletal: She exhibits no edema.  Neurological: She is alert and oriented to person, place, and time.  Skin: Skin is warm. No rash noted.  Psychiatric: She has a normal mood and affect.  Nursing note and vitals reviewed.    ED Treatments / Results  Labs (all labs ordered are listed, but only abnormal  results are displayed) Labs Reviewed  I-STAT BETA HCG BLOOD, ED ([facility], [facility], [facility] ONLY) - Abnormal; Notable for the following components:      Result Value   I-stat hCG, quantitative 6.1 (*)    All other components within normal limits  I-STAT CHEM 8, ED - Abnormal; Notable for the following components:   Potassium 2.9 (*)    BUN 25 (*)    Creatinine, Ser 1.50 (*)    Glucose, Bld 126 (*)    Calcium, Ion 1.12 (*)    Hemoglobin 19.0 (*)    HCT 56.0 (*)    All other components within normal limits  I-STAT TROPONIN, ED    EKG EKG Interpretation  Date/Time:  Monday May 07 2018 07:36:15 EST Ventricular Rate:  73 PR Interval:    QRS Duration: 82 QT Interval:  391 QTC Calculation: 431 R Axis:   6 Text Interpretation:  Sinus rhythm Borderline T abnormalities, diffuse leads Confirmed by Blane OharaZavitz, Cuinn Westerhold [phone]([id number]) on 05/07/2018 7:40:20 AM   Radiology Dg Chest 2 View  Result Date: 05/07/2018 CLINICAL DATA:  32 year old female with cough, nausea, dizziness, chest congestion and chills. EXAM: CHEST - 2 VIEW COMPARISON:  03/17/2015 chest radiographs. FINDINGS: Stable lung volumes. Mediastinal contours remain normal. Visualized tracheal air column is within normal limits. No pneumothorax, pleural effusion or confluent pulmonary opacity. Mild streaky lower lung predominant peribronchial and interstitial opacity suspected on the frontal view. Elsewhere lung markings remain stable since 2016. No acute osseous abnormality identified. Negative visible bowel gas pattern. IMPRESSION: No focal pneumonia or pleural effusion although lower lung mild streaky opacity on the frontal view is suspicious for viral/atypical respiratory infection in this clinical setting. Electronically Signed   By: Odessa FlemingH  Hall M.D.   On: 05/07/2018 07:55    Procedures Procedures (including critical care time)  Medications Ordered in ED Medications  potassium chloride 10 mEq in 100 mL IVPB (10 mEq Intravenous New Bag/Given 05/07/18  0949)  sodium chloride 0.9 % bolus 1,000 mL ( Intravenous Stopped 05/07/18 0910)  potassium chloride SA (K-DUR,KLOR-CON) CR tablet 40 mEq (40 mEq Oral Given 05/07/18 0906)     Initial Impression / Assessment and Plan / ED Course  I have reviewed the triage vital signs and the nursing notes.  Pertinent labs & imaging results that were available during my care of the patient were reviewed by me and considered in my medical decision making (see chart for details).    Patient presents with clinical concern for bronchitis versus atypical pneumonia versus flulike illness.  With chest discomfort EKG was done on arrival with no acute findings reviewed.  Patient's blood work reviewed showing acute renal failure  and hypokalemia due to decreased intake the past week.  IV fluid bolus ordered, oral fluids, potassium IV and oral ordered. Troponin neg, CP reproducable and clinically MSK/ Respiratory.  Chest x-ray reviewed no significant infiltrate.  Concern for viral  Infection.    Patient will need very close follow-up outpatient to ensure improvement in symptoms and improved blood work.  Patient stable for discharge at this time.  Results and differential diagnosis were discussed with the patient/parent/guardian. Xrays were independently reviewed by myself.  Close follow up outpatient was discussed, comfortable with the plan.   Medications  potassium chloride 10 mEq in 100 mL IVPB (10 mEq Intravenous New Bag/Given 05/07/18 0949)  sodium chloride 0.9 % bolus 1,000 mL ( Intravenous Stopped 05/07/18 0910)  potassium chloride SA (K-DUR,KLOR-CON) CR tablet 40 mEq (40 mEq Oral Given 05/07/18 0906)    Vitals:   05/07/18 0815 05/07/18 0830 05/07/18 0845 05/07/18 0900  BP:  114/81  112/73  Pulse: 86 68 70 69  Resp: 14 15 16 17   Temp:      TempSrc:      SpO2: 98% 100% 100% 100%  Weight:      Height:        Final diagnoses:  Acute bronchitis, unspecified organism  Chest wall pain  Hypokalemia  Acute  renal failure, unspecified acute renal failure type [facility])          Final Clinical Impressions(s) / ED Diagnoses   Final diagnoses:  Acute bronchitis, unspecified organism  Chest wall pain  Hypokalemia  Acute renal failure, unspecified acute renal failure type [facility])    ED Discharge Orders         Ordered    ondansetron (ZOFRAN ODT) 4 MG disintegrating tablet     05/07/18 1017    potassium chloride SA (K-DUR,KLOR-CON) 20 MEQ tablet  Daily     05/07/18 1017           Blane Ohara, MD 05/07/18 1019

## 2018-05-07 NOTE — ED Triage Notes (Addendum)
Pt c/o cough, nausea, dizziness/lightheadedness, chest congestion/tightness, and chills x 1 week. States symptoms worsened on Saturday.

## 2019-07-08 IMAGING — DX DG CHEST 2V
2 series · 2 of 2 positions shown · non-contrast
Comparison: 03/17/2015 chest radiographs.

CLINICAL DATA: 32-year-old female with cough, nausea, dizziness,
chest congestion and chills.

EXAM:
CHEST - 2 VIEW

[chest pa]
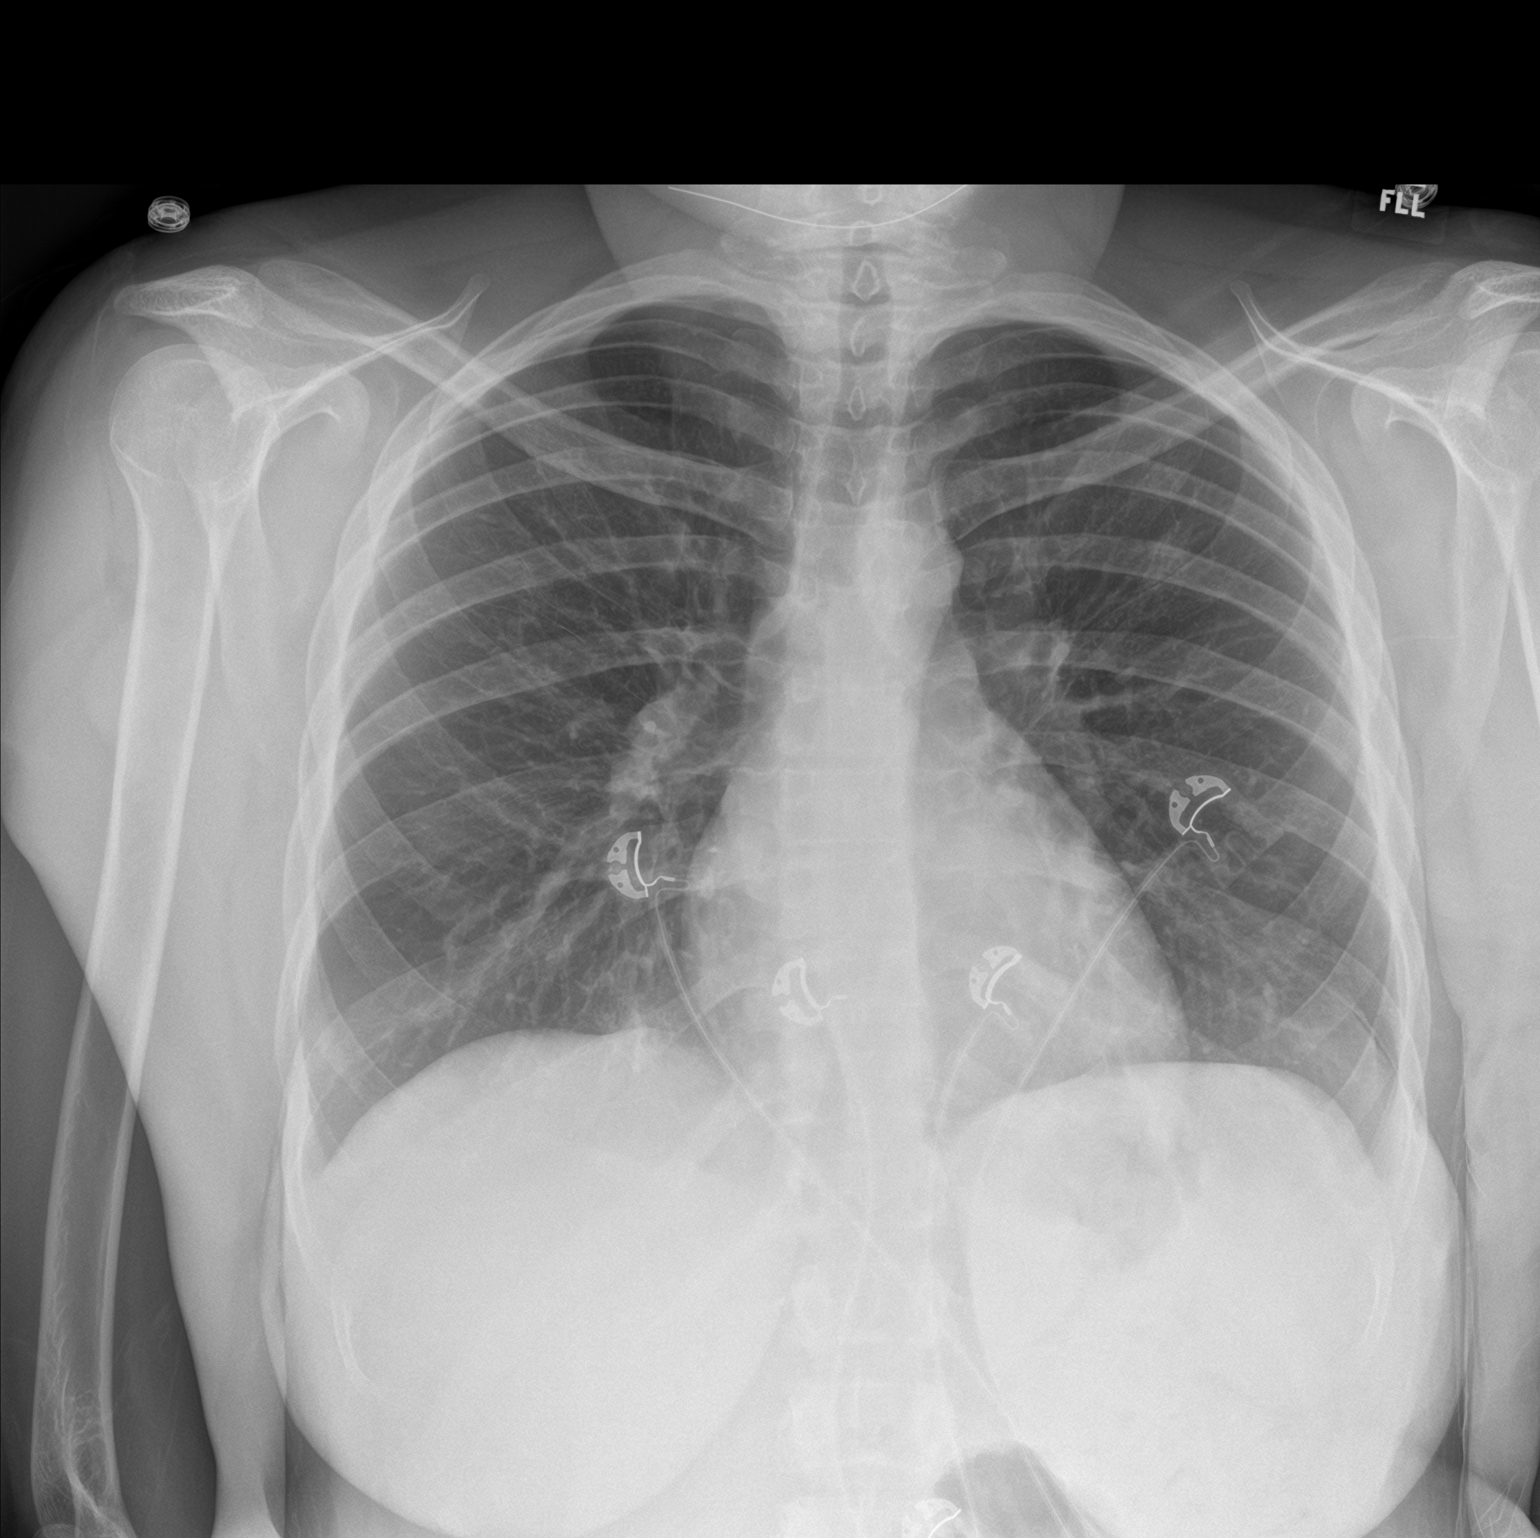

[chest lat]
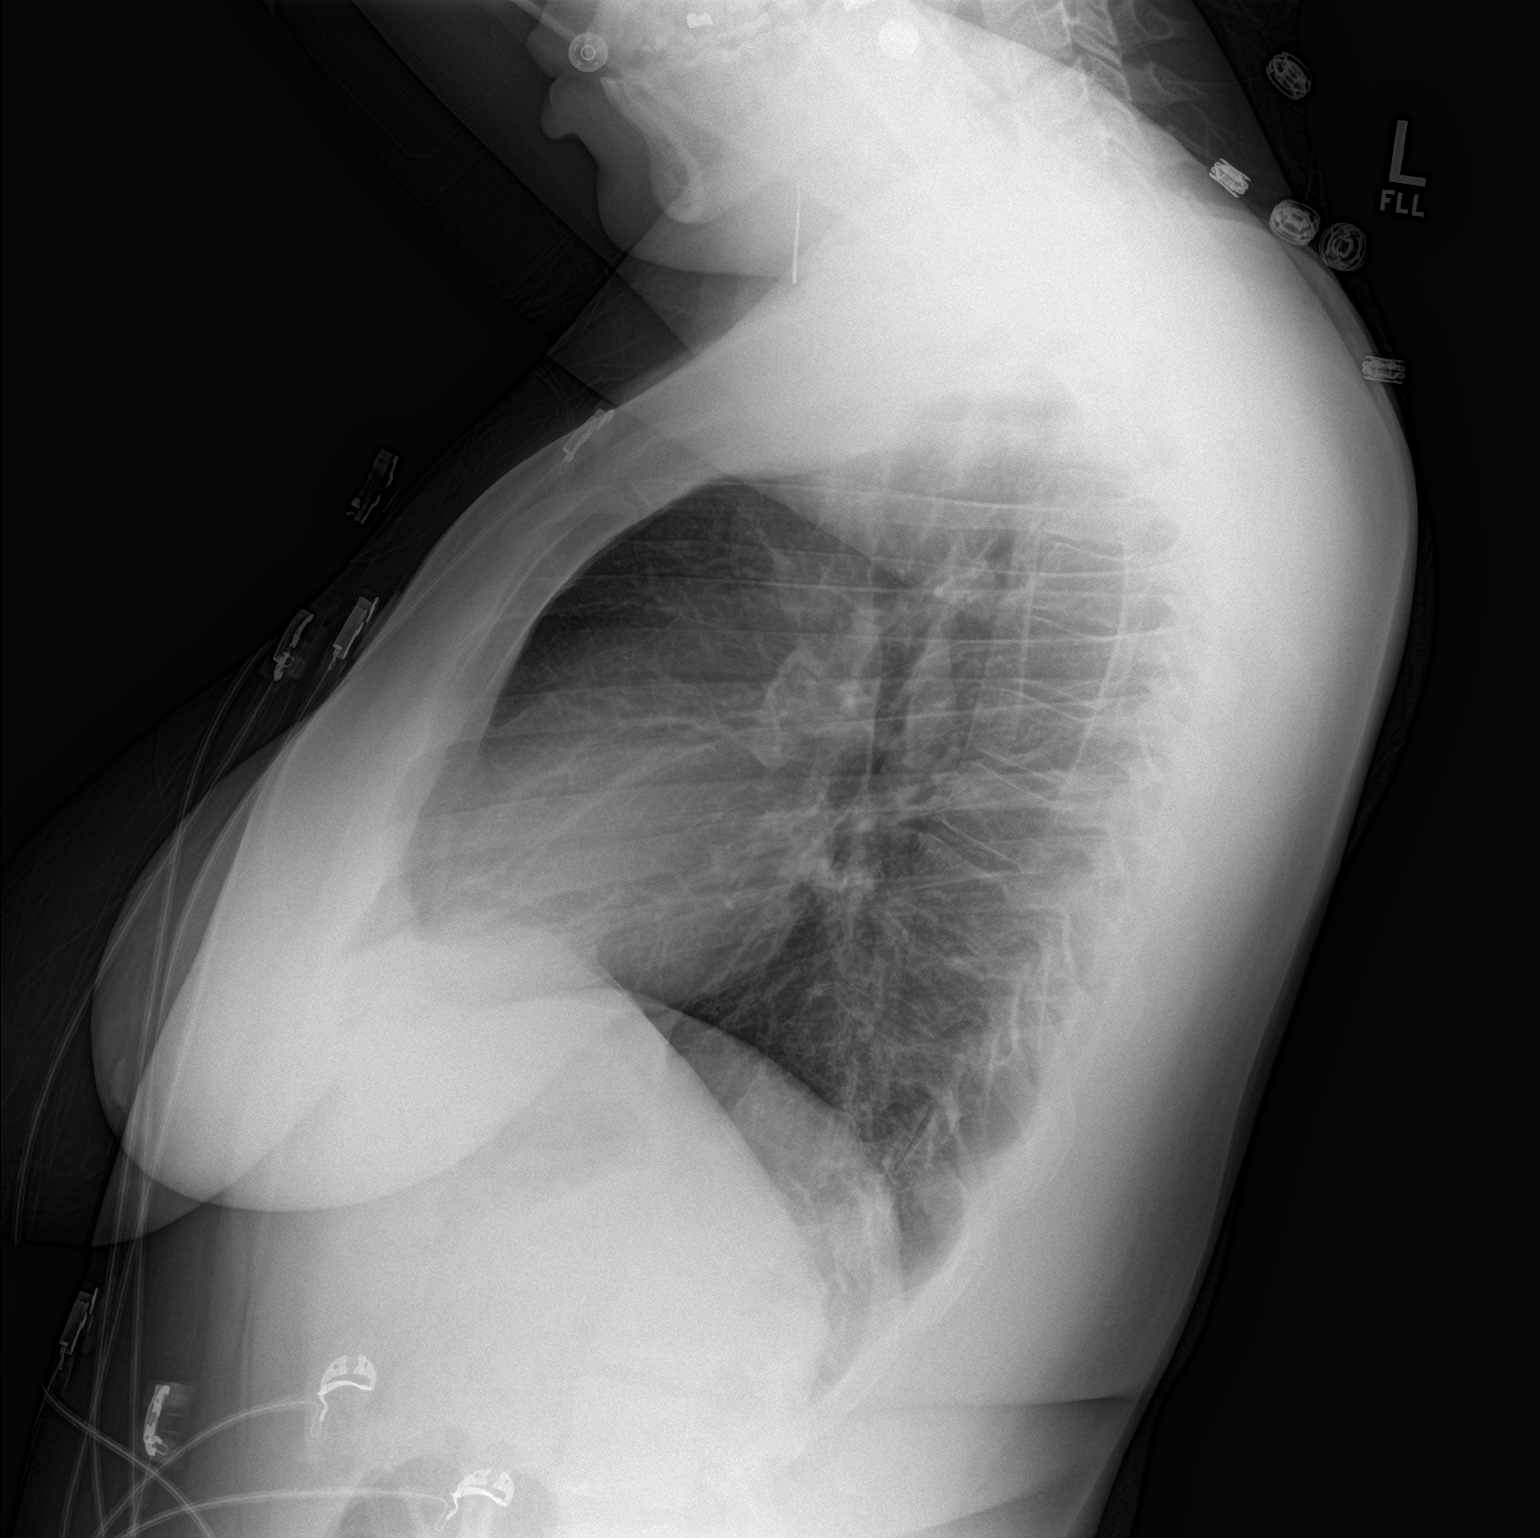

[2 of 2 positions shown; findings below may reference images not displayed]

FINDINGS: Stable lung volumes. Mediastinal contours remain normal. Visualized
tracheal air column is within normal limits. No pneumothorax,
pleural effusion or confluent pulmonary opacity. Mild streaky lower
lung predominant peribronchial and interstitial opacity suspected on
the frontal view. Elsewhere lung markings remain stable since 4198.
No acute osseous abnormality identified. Negative visible bowel gas
pattern.
IMPRESSION: No focal pneumonia or pleural effusion although lower lung mild
streaky opacity on the frontal view is suspicious for viral/atypical
respiratory infection in this clinical setting.

## 2020-03-19 ENCOUNTER — Other Ambulatory Visit: Payer: Self-pay | Admitting: *Deleted

## 2020-03-19 ENCOUNTER — Other Ambulatory Visit: Payer: Managed Care, Other (non HMO)

## 2020-03-19 DIAGNOSIS — Z20822 Contact with and (suspected) exposure to covid-19: Secondary | ICD-10-CM

## 2020-03-20 LAB — SPECIMEN STATUS REPORT

## 2020-03-20 LAB — SARS-COV-2, NAA 2 DAY TAT

## 2020-03-20 LAB — NOVEL CORONAVIRUS, NAA: SARS-CoV-2, NAA: DETECTED — AB

## 2020-03-21 ENCOUNTER — Telehealth: Payer: Self-pay | Admitting: Family

## 2020-03-21 NOTE — Telephone Encounter (Signed)
Called to Discuss with patient about Covid symptoms and the use of the monoclonal antibody infusion for those with mild to moderate Covid symptoms and at a high risk of hospitalization.     Pt appears to qualify for this infusion due to co-morbid conditions and/or a member of an at-risk group in accordance with the FDA Emergency Use Authorization.    Kellie Clark tested positive for COVID on 03/19/2020.  Qualifying risk factors include BMI greater than 25 and increased social vulnerability risk score. Symptoms started on 10/12 and currently having lack of taste/smell and being hot/cold.   I spoke with Kellie Clark regarding the risks, benefits, and potential financial charges related to monoclonal antibody therapy and she declines treatment at this time.  Hotline information provided should she change her mind.  Marcos Eke, NP 03/21/2020 8:57 AM

## 2020-03-28 ENCOUNTER — Other Ambulatory Visit: Payer: Managed Care, Other (non HMO)

## 2020-03-28 DIAGNOSIS — Z20822 Contact with and (suspected) exposure to covid-19: Secondary | ICD-10-CM

## 2020-03-29 LAB — SARS-COV-2, NAA 2 DAY TAT

## 2020-03-29 LAB — NOVEL CORONAVIRUS, NAA: SARS-CoV-2, NAA: NOT DETECTED

## 2021-06-15 ENCOUNTER — Other Ambulatory Visit: Payer: Self-pay

## 2021-06-15 ENCOUNTER — Ambulatory Visit (INDEPENDENT_AMBULATORY_CARE_PROVIDER_SITE_OTHER): Payer: Managed Care, Other (non HMO) | Admitting: Adult Health

## 2021-06-15 ENCOUNTER — Encounter: Payer: Self-pay | Admitting: Adult Health

## 2021-06-15 VITALS — BP 118/78 | HR 91 | Ht 64.0 in | Wt 212.0 lb

## 2021-06-15 DIAGNOSIS — O3680X0 Pregnancy with inconclusive fetal viability, not applicable or unspecified: Secondary | ICD-10-CM | POA: Diagnosis not present

## 2021-06-15 DIAGNOSIS — N926 Irregular menstruation, unspecified: Secondary | ICD-10-CM | POA: Insufficient documentation

## 2021-06-15 DIAGNOSIS — Z3201 Encounter for pregnancy test, result positive: Secondary | ICD-10-CM | POA: Insufficient documentation

## 2021-06-15 DIAGNOSIS — R110 Nausea: Secondary | ICD-10-CM | POA: Insufficient documentation

## 2021-06-15 DIAGNOSIS — Z3A10 10 weeks gestation of pregnancy: Secondary | ICD-10-CM | POA: Insufficient documentation

## 2021-06-15 MED ORDER — PROMETHAZINE HCL 25 MG PO TABS: 25.0000 mg | ORAL_TABLET | Freq: Four times a day (QID) | ORAL | 1 refills | Status: AC | PRN

## 2021-06-15 NOTE — Progress Notes (Signed)
°  Subjective:     Patient ID: Kellie Clark, female   DOB: 1985-11-18, 36 y.o.   MRN: [id number]  HPI [name] is a 36 year old black female, married, in for UPT has missed periods and had 2+HPTs, and has had nausea.   Review of Systems +missed periods with 2+HPTs +nausea Reviewed past medical,surgical, social and family history. Reviewed medications and allergies.     Objective:   Physical Exam BP 118/78 (BP Location: Left Arm, Patient Position: Sitting, Cuff Size: Large)    Pulse 91    Ht 5\' 4"  (1.626 m)    Wt 212 lb (96.2 kg)    LMP 04/01/2021    BMI 36.39 kg/m     UPT is positive, about 10+5 weeks by LPM with EDD 01/06/22. Skin warm and dry. Neck: mid line trachea, normal thyroid, good ROM, no lymphadenopathy noted. Lungs: clear to ausculation bilaterally. Cardiovascular: regular rate and rhythm. Abdomen is soft and non tender. AA is 0  Fall risk is low Depression screen PHQ 2/9 06/15/2021  Decreased Interest 1  Down, Depressed, Hopeless 0  PHQ - 2 Score 1  Altered sleeping 1  Tired, decreased energy 1  Change in appetite 1  Feeling bad or failure about yourself  0  Trouble concentrating 1  Moving slowly or fidgety/restless 0  Suicidal thoughts 0  PHQ-9 Score 5    GAD 7 : Generalized Anxiety Score 06/15/2021  Nervous, Anxious, on Edge 0  Control/stop worrying 0  Worry too much - different things 0  Trouble relaxing 1  Restless 0  Easily annoyed or irritable 1  Afraid - awful might happen 0  Total GAD 7 Score 2      Upstream - 06/15/21 1533       Pregnancy Intention Screening   Does the patient want to become pregnant in the next year? N/A    Does the patient's partner want to become pregnant in the next year? N/A    Would the patient like to discuss contraceptive options today? N/A      Contraception Wrap Up   Current Method Pregnant/Seeking Pregnancy    End Method Pregnant/Seeking Pregnancy    Contraception Counseling Provided No             Assessment:      1. Missed periods   2. Positive urine pregnancy test   3. [redacted] weeks gestation of pregnancy Take OTC PNV with Fe  4. Encounter to determine fetal viability of pregnancy, single or unspecified fetus Will get dating 08/13/21 in 1 week   5. Nausea Will rx phenergan  Meds ordered this encounter  Medications   promethazine (PHENERGAN) 25 MG tablet    Sig: Take 1 tablet (25 mg total) by mouth every 6 (six) hours as needed for nausea or vomiting.    Dispense:  30 tablet    Refill:  1    Order Specific Question:   Supervising Provider    Answer:   [place] [[id number]]       Plan:     Dating Lazaro Arms in 1 week Review handout by [facility]

## 2021-06-24 ENCOUNTER — Other Ambulatory Visit: Payer: Self-pay

## 2021-06-24 ENCOUNTER — Ambulatory Visit (INDEPENDENT_AMBULATORY_CARE_PROVIDER_SITE_OTHER): Payer: Managed Care, Other (non HMO)

## 2021-06-24 DIAGNOSIS — O3680X0 Pregnancy with inconclusive fetal viability, not applicable or unspecified: Secondary | ICD-10-CM

## 2021-06-24 DIAGNOSIS — Z3A12 12 weeks gestation of pregnancy: Secondary | ICD-10-CM

## 2021-06-24 NOTE — Progress Notes (Signed)
[place] 10+6 wks,single IUP with YS,CRL 40.31 mm,FHR 167 bpm,normal ovaries

## 2021-07-12 ENCOUNTER — Ambulatory Visit (INDEPENDENT_AMBULATORY_CARE_PROVIDER_SITE_OTHER): Payer: Managed Care, Other (non HMO) | Admitting: Women's Health

## 2021-07-12 ENCOUNTER — Ambulatory Visit: Payer: Managed Care, Other (non HMO) | Admitting: *Deleted

## 2021-07-12 ENCOUNTER — Telehealth: Payer: Self-pay | Admitting: Women's Health

## 2021-07-12 ENCOUNTER — Other Ambulatory Visit: Payer: Self-pay

## 2021-07-12 ENCOUNTER — Encounter: Payer: Self-pay | Admitting: Women's Health

## 2021-07-12 ENCOUNTER — Telehealth: Payer: Self-pay | Admitting: Adult Health

## 2021-07-12 VITALS — BP 122/87 | HR 101 | Wt 218.0 lb

## 2021-07-12 DIAGNOSIS — Z3490 Encounter for supervision of normal pregnancy, unspecified, unspecified trimester: Secondary | ICD-10-CM | POA: Insufficient documentation

## 2021-07-12 DIAGNOSIS — O09299 Supervision of pregnancy with other poor reproductive or obstetric history, unspecified trimester: Secondary | ICD-10-CM | POA: Diagnosis not present

## 2021-07-12 DIAGNOSIS — Z6836 Body mass index (BMI) 36.0-36.9, adult: Secondary | ICD-10-CM | POA: Diagnosis not present

## 2021-07-12 DIAGNOSIS — Z3A13 13 weeks gestation of pregnancy: Secondary | ICD-10-CM | POA: Diagnosis not present

## 2021-07-12 DIAGNOSIS — O021 Missed abortion: Secondary | ICD-10-CM | POA: Diagnosis not present

## 2021-07-12 MED ORDER — ASPIRIN 81 MG PO TBEC: 162.0000 mg | DELAYED_RELEASE_TABLET | Freq: Every day | ORAL | 2 refills | Status: DC

## 2021-07-12 NOTE — Progress Notes (Signed)
° °  GYN VISIT Patient name: Kellie Clark MRN [id number]  Date of birth: June 15, 1985 Chief Complaint:   Initial Prenatal Visit  History of Present Illness:   Kellie Clark is a 36 y.o. G91P2002 African-American female at [redacted]w[redacted]d by 10.6wk u/s, being seen today initially for new ob visit, however there is no FCA on informal TA u/s. Some mild cramping. No bleeding.     Patient's last menstrual period was 04/01/2021. Depression screen [facility] 2/9 07/12/2021 06/15/2021  Decreased Interest 1 1  Down, Depressed, Hopeless 0 0  PHQ - 2 Score 1 1  Altered sleeping 1 1  Tired, decreased energy 1 1  Change in appetite 1 1  Feeling bad or failure about yourself  0 0  Trouble concentrating 0 1  Moving slowly or fidgety/restless 0 0  Suicidal thoughts 0 0  PHQ-9 Score 4 5     GAD 7 : Generalized Anxiety Score 07/12/2021 06/15/2021  Nervous, Anxious, on Edge 0 0  Control/stop worrying 0 0  Worry too much - different things 0 0  Trouble relaxing - 1  Restless 0 0  Easily annoyed or irritable 1 1  Afraid - awful might happen 0 0  Total GAD 7 Score - 2     Review of Systems:   Pertinent items are noted in HPI Denies fever/chills, dizziness, headaches, visual disturbances, fatigue, shortness of breath, chest pain, abdominal pain, vomiting, abnormal vaginal discharge/itching/odor/irritation, problems with periods, bowel movements, urination, or intercourse unless otherwise stated above.  Pertinent History Reviewed:  Reviewed past medical,surgical, social, obstetrical and family history.  Reviewed problem list, medications and allergies. Physical Assessment:   Vitals:   07/12/21 1339  BP: 122/87  Pulse: (!) 101  Weight: 218 lb (98.9 kg)  Body mass index is 37.42 kg/m.       Physical Examination:   General appearance: alert, well appearing, and in no distress  Mental status: alert, oriented to person, place, and time  Skin: warm & dry   Cardiovascular: normal heart rate noted  Respiratory:  normal respiratory effort, no distress  Abdomen: soft, non-tender   Pelvic: examination not indicated  Extremities: no edema   Unable to hear FHR w/ doppler, informal TA u/s w/o FCA or fetal movement, Engineer, building services not here this week. Dr. Nelda Marseille in to confirm, she does not see FCA/fetal movement either  Chaperone: N/A    No results found for this or any previous visit (from the past 24 hour(s)).  Assessment & Plan:  1) [redacted]w[redacted]d missed Ab> per Dr. Nelda Marseille can do cytotec or D&C, discussed options w/ pt/husband, reviewed risks/benefits of both, they want to go home and think/discuss, will let me know tomorrow. Rh+  No orders of the defined types were placed in this encounter.   Return for pt will call back tomorrow.  [place], [facility] 07/12/2021 4:15 PM

## 2021-07-12 NOTE — Telephone Encounter (Signed)
Pt had called back and stated they wanted to proceed w/ formal u/s and then surgery. Discussed w/ Dr. Charlotta Newton, will get formal u/s scheduled (Amber not here this week- so note sent to [facility] and [facility] to see where they can get pt in fastest), then as soon as results back Dr. Charlotta Newton will get [facility] scheduled. Reviewed all w/ pt, as well as reasons to seek care while waiting (severe pain, heavy bleeding, fever/chills, etc).  Cheral Marker, CNM, WHNP-BC 07/12/2021 5:00 PM

## 2021-07-12 NOTE — Telephone Encounter (Signed)
Called patient back, she was wanting to speak with Knute Neu. I transferred call to her.

## 2021-07-12 NOTE — Patient Instructions (Addendum)
FACTS YOU SHOULD KNOW  About Early Pregnancy Loss  WHAT IS AN EARLY PREGNANCY LOSS? Once the egg is fertilized with the sperm and begins to develop, it attaches to the lining of the uterus. This early pregnancy tissue may not develop into an embryo (the beginning stage of a baby). Sometimes an embryo does develop but does not continue to grow. These problems can be seen on ultrasound.  Many women experience the loss of a pregnancy during their lifetime.  As many as 10-30% of pregnancies end in pregnancy loss within the first trimester (or first 12-14 weeks).  MANAGEMNT OF EARLY PREGNANCY LOSS: There are 3 ways to care for an early pregnancy loss:   Surgery, (2) Medicine, (3) Waiting for you to pass the pregnancy on your own. The decision as to how to proceed after being diagnosed with and early pregnancy loss is an individual one.  The decision can be made only after appropriate counseling.  You need to weigh the pros and cons of the 3 choices. Then you can make the choice that works for you.  SURGERY (D&E) Procedure over in 1 day Requires being put to sleep Bleeding may be light Possible problems during surgery, including injury to womb(uterus) Care provider has more control Medicine (CYTOTEC) The complete procedure may take days to weeks No Surgery Bleeding may be heavy at times There may be drug side effects Patient has more control Waiting You may choose to wait, in which case your own body may complete the passing of the abnormal early pregnancy on its own in about 2-4 weeks Your bleeding may be heavy at times There is a small possibility that you may need surgery if the bleeding is too much or not all of the pregnancy has passed.  CYTOTEC MANAGEMENT Prostaglandins (cytotec) are the most widely used drug for this purpose. They cause the uterus to cramp and contract. You will place the medicine yourself inside your vagina in the privacy of your home. Empting of the uterus should  occur within 3 days but the process may continue for several weeks. The bleeding may seem heavy at times.  INSTRUCTIONS: Take all 4 tablets of cytotec (800mcg total) at one time. This will cause a lot of cramping, you may have bleeding, and pass tissue, then the cramping and bleeding should get better. If you do not pass the tissue, then you can take 4 more tablets of cytotec (800mcg total) 48 hours after your first dose.  You will come back to have your blood drawn to make sure the pregnancy hormones are dropping in 1 week. Please call us if you have any questions.   POSSIBLE SIDE EFFECTS FROM CYTOTEC Nausea  Vomiting Diarrhea Fever Chills  Hot Flashes Side effects  from the process of the early pregnancy loss include: Cramping  Bleeding Headaches  Dizziness RISKS: This is a low risk procedure. Less than 1 in 100 women has a complication. An incomplete passage of the early pregnancy may occur. Also, hemorrhage (heavy bleeding) could happen.  Rarely the pregnancy will not be passed completely. Excessively heavy bleeding may occur.  Your doctor may need to perform surgery to empty the uterus (D&E). Afterwards: Everybody will feel differently after the early pregnancy loss completion. You may have soreness or cramps for a day or two. You may have soreness or cramps for day or two.  You may have light bleeding for up to 2 weeks. You may be as active as you feel like being. If you   have any of the following problems you may call [facility] at [phone] or Maternity Admissions Unit at [phone] if it is after hours. If you have pain that does not get better with pain medication Bleeding that soaks through 2 thick full-sized sanitary pads in an hour Cramps that last longer than 2 days Foul smelling discharge Fever above 100.4 degrees F Even if you do not have any of these symptoms, you should have a follow-up exam to make sure you are healing properly. Your next normal period will usually start  again in 4-6 week after the loss. You can get pregnant soon after the loss, so use birth control right away. Finally: Make sure all your questions are answered before during and after any procedure. Follow up with medical care and family planning methods.     

## 2021-07-12 NOTE — Telephone Encounter (Signed)
Patient requests a call back from [place].

## 2021-07-13 ENCOUNTER — Other Ambulatory Visit: Payer: Self-pay | Admitting: *Deleted

## 2021-07-13 DIAGNOSIS — O3680X0 Pregnancy with inconclusive fetal viability, not applicable or unspecified: Secondary | ICD-10-CM

## 2021-07-14 ENCOUNTER — Encounter: Payer: Self-pay | Admitting: Women's Health

## 2021-07-14 ENCOUNTER — Other Ambulatory Visit: Payer: Self-pay

## 2021-07-14 ENCOUNTER — Ambulatory Visit (HOSPITAL_COMMUNITY)
Admission: RE | Admit: 2021-07-14 | Discharge: 2021-07-14 | Disposition: A | Discharge status: Home / Self Care | Payer: Managed Care, Other (non HMO) | Source: Ambulatory Visit | Attending: Women's Health | Admitting: Women's Health

## 2021-07-14 DIAGNOSIS — O3680X0 Pregnancy with inconclusive fetal viability, not applicable or unspecified: Secondary | ICD-10-CM | POA: Diagnosis not present

## 2021-07-19 ENCOUNTER — Telehealth: Payer: Self-pay | Admitting: Women's Health

## 2021-07-19 NOTE — Telephone Encounter (Signed)
Patient is calling about her ultra sound that she had last  week. States that no one has called her back with the results.

## 2021-07-19 NOTE — Telephone Encounter (Signed)
Returned patient's call regarding her ultrasound report.  Informed Maudie Mercury has been out of the office so apologized for the delay. Informed I discussed with Dr Elonda Husky and ultrasound did confirm miscarriage so she will need D & C. Dr Elonda Husky will get in touch with Dr Nelda Marseille to get this scheduled.  Pt verbalized understanding.

## 2021-07-22 ENCOUNTER — Telehealth: Payer: Self-pay | Admitting: Obstetrics & Gynecology

## 2021-07-22 ENCOUNTER — Encounter: Payer: Self-pay | Admitting: *Deleted

## 2021-07-22 NOTE — Patient Instructions (Addendum)
Dilation and Curettage or Vacuum Curettage Dilation and curettage (D&C) and vacuum curettage are minor procedures. A D&C involves stretching the cervix (dilation) and scraping the inside lining of the uterus, or endometrium, with surgical instruments (curettage). During a D&C, tissue is gently scraped starting from the top of the uterus down to the lowest part of the uterus. During a vacuum curettage, the lining and tissue in the uterus are removed using gentle suction. Curettage may be performed to either diagnose or treat a problem. A diagnostic curettage may be done if you have: Irregular bleeding or clotting from the uterus. Spotting between menstrual periods, prolonged menstrual periods, or other abnormal bleeding. Bleeding after menopause. No menstrual period (amenorrhea). A change in the size and shape of the uterus. Abnormal endometrial cells discovered during a Pap test. For treatment, curettage may be done: To remove an IUD (intrauterine device). To remove the remaining placenta after giving birth. During an abortion or after a miscarriage. To remove growths in the lining of the uterus. To remove certain rare types of noncancerous lumps (fibroids). Tell a health care provider about: Any allergies you have, including allergies to prescribed medicine or latex. All medicines you are taking, including vitamins, herbs, eye drops, creams, and over-the-counter medicines. Any problems you or family members have had with anesthetic medicines. Any blood disorders you have. Any surgeries you have had. Your medical history and any medical conditions you have. Whether you are pregnant or may be pregnant. Recent vaginal infections you have had. Recent menstrual periods, bleeding problems you have had, and what form of birth control (contraception) you use. What are the risks? Generally, this is a safe procedure. However, problems may occur, including: Infection. Heavy vaginal  bleeding. Allergic reactions to medicines. Damage to the cervix or nearby structures or organs. Scar tissue developing inside the uterus. This can cause abnormal periods and may make it harder to get pregnant. A hole (perforation) in the wall of the uterus. This is rare. What happens before the procedure? Staying hydrated Follow instructions from your health care provider about hydration, which may include: Up to 2 hours before the procedure - you may continue to drink clear liquids, such as water, clear fruit juice, black coffee, and plain tea.  Eating and drinking restrictions Follow instructions from your health care provider about eating and drinking, which may include: 8 hours before the procedure - stop eating heavy meals or foods, such as meat, fried foods, or fatty foods. 6 hours before the procedure - stop eating light meals or foods, such as toast or cereal. 6 hours before the procedure - stop drinking milk or drinks that contain milk. 2 hours before the procedure - stop drinking clear liquids. If your health care provider told you to take your medicine on the day of your procedure, take them with only a sip of water. Medicines Ask your health care provider about: Changing or stopping your regular medicines. This is especially important if you are taking diabetes medicines or blood thinners. Taking medicines such as aspirin and ibuprofen. These medicines can thin your blood. Do not take these medicines unless your health care provider tells you to take them. Taking over-the-counter medicines, vitamins, herbs, and supplements. You may be given a medicine to soften the cervix. This will help with dilation. Tests You may be given a pregnancy test on the day of the procedure. You may have a blood or urine sample taken. General instructions Do not use any products that contain nicotine or tobacco  for at least 4 weeks before the procedure. These products include cigarettes, chewing  tobacco, and vaping devices, such as e-cigarettes. If you need help quitting, ask your health care provider. For 24 hours before your procedure: Do not douche, use tampons, or have sex. Do not use medicines, creams, or suppositories in the vagina. Ask your health care provider what steps will be taken to help prevent infection. These may include: Removing hair at the procedure site. Washing skin with a germ-killing soap. Taking antibiotic medicine. Plan to have a responsible adult take you home from the hospital or clinic. If you will be going home right after the procedure, plan to have a responsible adult care for you for the time you are told. This is important. What happens during the procedure?  An IV will be inserted into one of your veins. You will be given one of the following: A medicine that numbs the area in and around the cervix (local anesthetic). A medicine to make you fall asleep (general anesthetic). You will lie down on your back, with your feet in foot rests (stirrups). The size and position of your uterus will be checked. A lubricated instrument (speculum or Sims retractor) will be inserted into your vagina to widen its walls. This will allow your health care provider to see your cervix. Your cervix will be softened and dilated. This may be done by: Taking medicine by mouth or vaginally. Having thin rods or gradual widening instruments inserted into your cervix. A small, sharp, curved instrument (curette) will be used to scrape a small amount of tissue or cells from the endometrium or cervical canal. In some cases, gentle suction is applied with the curette. The cells will be taken to a lab for testing. The procedure may vary among health care providers and hospitals. What happens after the procedure? Your blood pressure, heart rate, breathing rate, and blood oxygen level will be monitored until you leave the hospital or clinic. You may have mild cramping, a backache,  pain, and light bleeding or spotting. You may pass small blood clots from your vagina. You may have to wear compression stockings. These stockings help to prevent blood clots and reduce swelling in your legs. It is up to you to get the results of your procedure. Ask your health care provider, or the department that is doing the procedure, when your results will be ready. Summary Dilation and curettage (D&C) involves stretching (dilating) the cervix and scraping the inside lining of the uterus with surgical instruments (curettage). Follow your health care provider's instructions about when to stop eating and drinking before the procedure, and whether to stop or change any medicines. After the procedure, you may have mild cramping, a backache, pain, and light bleeding or spotting. You may pass small blood clots from your vagina. Plan to have a responsible adult take you home from the hospital or clinic. This information is not intended to replace advice given to you by your health care provider. Make sure you discuss any questions you have with your health care provider.  General Anesthesia, Adult General anesthesia is the use of medicines to make a person "go to sleep" (unconscious) for a medical procedure. General anesthesia must be used for certain procedures, and is often recommended for procedures that: Last a long time. Require you to be still or in an unusual position. Are major and can cause blood loss. The medicines used for general anesthesia are called general anesthetics. As well as making you unconscious for  a certain amount of time, these medicines: Prevent pain. Control your blood pressure. Relax your muscles. Tell a health care provider about: Any allergies you have. All medicines you are taking, including vitamins, herbs, eye drops, creams, and over-the-counter medicines. Any problems you or family members have had with anesthetic medicines. Types of anesthetics you have had in  the past. Any blood disorders you have. Any surgeries you have had. Any medical conditions you have. Any recent upper respiratory, chest, or ear infections. Any history of: Heart or lung conditions, such as heart failure, sleep apnea, asthma, or chronic obstructive pulmonary disease (COPD). Financial plannerMilitary service. Depression or anxiety. Any tobacco or drug use, including marijuana or alcohol use. Whether you are pregnant or may be pregnant. What are the risks? Generally, this is a safe procedure. However, problems may occur, including: Allergic reaction. Lung and heart problems. Inhaling food or liquid from the stomach into the lungs (aspiration). Nerve injury. Dental injury. Air in the bloodstream, which can lead to stroke. Extreme agitation or confusion (delirium) when you wake up from the anesthetic. Waking up during your procedure and being unable to move. This is rare. These problems are more likely to develop if you are having a major surgery or if you have an advanced or serious medical condition. You can prevent some of these complications by answering all of your health care provider's questions thoroughly and by following all instructions before your procedure. General anesthesia can cause side effects, including: Nausea or vomiting. A sore throat from the breathing tube. Hoarseness. Wheezing or coughing. Shaking chills. Tiredness. Body aches. Anxiety. Sleepiness or drowsiness. Confusion or agitation. What happens before the procedure? Staying hydrated Follow instructions from your health care provider about hydration, which may include: Up to 2 hours before the procedure - you may continue to drink clear liquids, such as water, clear fruit juice, black coffee, and plain tea.  Eating and drinking restrictions Follow instructions from your health care provider about eating and drinking, which may include: 8 hours before the procedure - stop eating heavy meals or foods such  as meat, fried foods, or fatty foods. 6 hours before the procedure - stop eating light meals or foods, such as toast or cereal. 6 hours before the procedure - stop drinking milk or drinks that contain milk. 2 hours before the procedure - stop drinking clear liquids. Medicines Ask your health care provider about: Changing or stopping your regular medicines. This is especially important if you are taking diabetes medicines or blood thinners. Taking medicines such as aspirin and ibuprofen. These medicines can thin your blood. Do not take these medicines unless your health care provider tells you to take them. Taking over-the-counter medicines, vitamins, herbs, and supplements. Do not take these during the week before your procedure unless your health care provider approves them. General instructions Starting 3-6 weeks before the procedure, do not use any products that contain nicotine or tobacco, such as cigarettes and e-cigarettes. If you need help quitting, ask your health care provider. If you brush your teeth on the morning of the procedure, make sure to spit out all of the toothpaste. Tell your health care provider if you become ill or develop a cold, cough, or fever. If instructed by your health care provider, bring your sleep apnea device with you on the day of your surgery (if applicable). Ask your health care provider if you will be going home the same day, the following day, or after a longer hospital stay. Plan to have  a responsible adult take you home from the hospital or clinic. Plan to have a responsible adult care for you for the time you are told after you leave the hospital or clinic. This is important. What happens during the procedure?  You will be given anesthetics through both of the following: A mask placed over your nose and mouth. An IV in one of your veins. You may receive a medicine to help you relax (sedative). After you are unconscious, a breathing tube may be inserted  down your throat to help you breathe. This will be removed before you wake up. An anesthesia specialist will stay with you throughout your procedure. He or she will: Keep you comfortable and safe by continuing to give you medicines and adjusting the amount of medicine that you get. Monitor your blood pressure, pulse, and oxygen levels to make sure that the anesthetics do not cause any problems. The procedure may vary among health care providers and hospitals. What happens after the procedure? Your blood pressure, temperature, heart rate, breathing rate, and blood oxygen level will be monitored until the medicines you were given have worn off. You will wake up in a recovery area. You may wake up slowly. If you feel anxious or agitated, you may be given medicine to help you calm down. If you will be going home the same day, your health care provider may check to make sure you can walk, drink, and urinate. Your health care provider will treat any pain or side effects you have before you go home. Do not drive or operate machinery until your health care provider says that it is safe. Summary General anesthesia is used to keep you still and prevent pain during a procedure. It is important to tell your health care provider about your medical history and any surgeries you have had, and previous experience with anesthesia. Follow your health care provider's instructions about when to stop eating, drinking, or taking certain medicines before your procedure. Plan to have a responsible adult take you home from the hospital or clinic. This information is not intended to replace advice given to you by your health care provider. Make sure you discuss any questions you have with your health care provider. Document Revised: 02/03/2020 Document Reviewed: 09/04/2019 Elsevier Patient Education  2022 Elsevier Inc.  Document Revised: 05/13/2020 Document Reviewed: 05/13/2020 Elsevier Patient Education  2022  Elsevier Inc.    Kellie Clark  07/22/2021     @[email or web address] @   Your procedure is scheduled on 07/27/2021.  Report to [facility] at 7:15 A.M.  Call this number if you have problems the morning of surgery:  [phone]   Remember:  Do not eat or drink after midnight.      Take these medicines the morning of surgery with A SIP OF WATER : Phenergan if needed    Do not wear jewelry, make-up or nail polish.  Do not wear lotions, powders, or perfumes, or deodorant.  Do not shave 48 hours prior to surgery.  Men may shave face and neck.  Do not bring valuables to the hospital.  [facility] is not responsible for any belongings or valuables.  Contacts, dentures or bridgework may not be worn into surgery.  Leave your suitcase in the car.  After surgery it may be brought to your room.  For patients admitted to the hospital, discharge time will be determined by your treatment team.  Patients discharged the day of surgery will not be allowed to drive home.  Name and phone number of your driver:   family Special instructions:  N/A  Please read over the following fact sheets that you were given. Care and Recovery After Surgery

## 2021-07-22 NOTE — Telephone Encounter (Signed)
Patient is calling to see if she could get a letter stating when your [facility] is and how long she should accepted to be out of work. Her and she also states that the letter needs to have the provider that is doing the procedure to be listed as well.

## 2021-07-22 NOTE — Telephone Encounter (Signed)
Letter completed for patient. Front desk to inform patient and print.

## 2021-07-23 ENCOUNTER — Encounter (HOSPITAL_COMMUNITY): Payer: Self-pay

## 2021-07-23 ENCOUNTER — Encounter (HOSPITAL_COMMUNITY)
Admission: RE | Admit: 2021-07-23 | Discharge: 2021-07-23 | Disposition: A | Discharge status: Home / Self Care | Payer: Managed Care, Other (non HMO) | Source: Ambulatory Visit | Attending: Obstetrics & Gynecology | Admitting: Obstetrics & Gynecology

## 2021-07-23 VITALS — BP 133/77 | HR 101 | Temp 97.7°F | Resp 18 | Ht 64.0 in | Wt 220.0 lb

## 2021-07-23 DIAGNOSIS — Z01818 Encounter for other preprocedural examination: Secondary | ICD-10-CM

## 2021-07-23 DIAGNOSIS — O021 Missed abortion: Secondary | ICD-10-CM

## 2021-07-23 DIAGNOSIS — Z01812 Encounter for preprocedural laboratory examination: Secondary | ICD-10-CM | POA: Diagnosis not present

## 2021-07-23 LAB — CBC WITH DIFFERENTIAL/PLATELET
Abs Immature Granulocytes: 0.02 10*3/uL (ref 0.00–0.07)
Basophils Absolute: 0 10*3/uL (ref 0.0–0.1)
Basophils Relative: 0 %
Eosinophils Absolute: 0.2 10*3/uL (ref 0.0–0.5)
Eosinophils Relative: 3 %
HCT: 34.6 % — ABNORMAL LOW (ref 36.0–46.0)
Hemoglobin: 11.1 g/dL — ABNORMAL LOW (ref 12.0–15.0)
Immature Granulocytes: 0 %
Lymphocytes Relative: 27 %
Lymphs Abs: 2 10*3/uL (ref 0.7–4.0)
MCH: 26.1 pg (ref 26.0–34.0)
MCHC: 32.1 g/dL (ref 30.0–36.0)
MCV: 81.4 fL (ref 80.0–100.0)
Monocytes Absolute: 0.5 10*3/uL (ref 0.1–1.0)
Monocytes Relative: 7 %
Neutro Abs: 4.7 10*3/uL (ref 1.7–7.7)
Neutrophils Relative %: 63 %
Platelets: 307 10*3/uL (ref 150–400)
RBC: 4.25 MIL/uL (ref 3.87–5.11)
RDW: 15.6 % — ABNORMAL HIGH (ref 11.5–15.5)
WBC: 7.3 10*3/uL (ref 4.0–10.5)
nRBC: 0 % (ref 0.0–0.2)

## 2021-07-26 MED ORDER — SODIUM CHLORIDE 0.9 % IV SOLN
100.0000 mg | Freq: Once | INTRAVENOUS | Status: AC
  Administered 2021-07-27: 100 mg via INTRAVENOUS
  Filled 2021-07-26: qty 100

## 2021-07-26 NOTE — H&P (Addendum)
[facility] Gynecology Attending History and Physical  Kellie Clark is a 36 y.o. [id number] who presents for scheduled D&E due to miscarriage.  In review, she denies vaginal bleeding or cramping.  Presented for initial OB visit, no FHT noted by [place]. Official US completed 07/14/21- IUP with 13wk CRL, no FHT  Denies any abnormal vaginal discharge, fevers, chills, sweats, dysuria, nausea, vomiting, other GI or GU symptoms or other general symptoms.  Past Medical History:  Diagnosis Date   Anemia    Gallbladder disease    x2 weeks starting 1 week postpartum with G1   GERD (gastroesophageal reflux disease)    No past surgical history on file. OB History  Gravida Para Term Preterm AB Living  3 2 2     2   SAB IAB Ectopic Multiple Live Births          2    # Outcome Date GA Lbr Len/2nd Weight Sex Delivery Anes PTL Lv  3 Gravida           2 Term 08/28/12 [redacted]w[redacted]d 06:41 / 00:22 3605 g F Vag-Spont EPI N LIV  1 Term 04/25/10 [redacted]w[redacted]d  2722 g F Vag-Spont EPI N LIV  Patient denies any other pertinent gynecologic issues.  No current facility-administered medications on file prior to encounter.   Current Outpatient Medications on File Prior to Encounter  Medication Sig Dispense Refill   diphenhydrAMINE (BENADRYL) 25 MG tablet Take 25 mg by mouth daily as needed for allergies.     Prenatal Vit-Fe Fumarate-FA (PRENATAL VITAMIN PO) Take by mouth.     promethazine (PHENERGAN) 25 MG tablet Take 1 tablet (25 mg total) by mouth every 6 (six) hours as needed for nausea or vomiting. 30 tablet 1   aspirin 81 MG EC tablet Take 2 tablets (162 mg total) by mouth daily. Swallow whole. (Patient not taking: Reported on 07/20/2021) 180 tablet 2   Allergies  Allergen Reactions   Ceclor [Cefaclor] Hives and Nausea And Vomiting    Social History:   reports that she has never smoked. She has never used smokeless tobacco. She reports that she does not drink alcohol and does not use drugs. Family  History  Problem Relation Age of Onset   Stroke Mother    High Cholesterol Father    Seizures Daughter    Diabetes Maternal Grandmother    Stroke Maternal Grandmother    Heart attack Maternal Grandmother    Cancer Paternal Grandmother        breast   Heart disease Paternal Grandfather    Diabetes Paternal Grandfather    Hypertension Paternal Grandfather    Sickle cell anemia Maternal Aunt     Review of Systems: Pertinent items noted in HPI and remainder of comprehensive ROS otherwise negative. BP 116/80    Pulse 93    Resp 17    Ht 5\' 4"  (1.626 m)    Wt 96.2 kg    SpO2 100%    Breastfeeding No    BMI 36.39 kg/m   CONSTITUTIONAL: Well-developed, well-nourished female in no acute distress.  SKIN: Skin is warm and dry. No rash noted. Not diaphoretic. No erythema. No pallor. NEUROLOGIC: Alert and oriented to person, place, and time. Normal reflexes, muscle tone coordination.  PSYCHIATRIC: Normal mood and affect. Normal behavior. Normal judgment and thought content. CARDIOVASCULAR: Normal heart rate noted, regular rhythm RESPIRATORY: Effort and breath sounds normal, no problems with respiration noted ABDOMEN: Soft, nontender, nondistended. PELVIC: deferred MUSCULOSKELETAL: no calf tenderness  bilaterally EXT: no edema bilaterally, normal pulses  Labs: Results for orders placed or performed during the hospital encounter of 07/23/21 (from the past 336 hour(s))  CBC with Differential/Platelet   Collection Time: 07/23/21  3:31 PM  Result Value Ref Range   WBC 7.3 4.0 - 10.5 K/uL   RBC 4.25 3.87 - 5.11 MIL/uL   Hemoglobin 11.1 (L) 12.0 - 15.0 g/dL   HCT 16.1 (L) 09.6 - 04.5 %   MCV 81.4 80.0 - 100.0 fL   MCH 26.1 26.0 - 34.0 pg   MCHC 32.1 30.0 - 36.0 g/dL   RDW 40.9 (H) 81.1 - 91.4 %   Platelets 307 150 - 400 K/uL   nRBC 0.0 0.0 - 0.2 %   Neutrophils Relative % 63 %   Neutro Abs 4.7 1.7 - 7.7 K/uL   Lymphocytes Relative 27 %   Lymphs Abs 2.0 0.7 - 4.0 K/uL   Monocytes Relative  7 %   Monocytes Absolute 0.5 0.1 - 1.0 K/uL   Eosinophils Relative 3 %   Eosinophils Absolute 0.2 0.0 - 0.5 K/uL   Basophils Relative 0 %   Basophils Absolute 0.0 0.0 - 0.1 K/uL   Immature Granulocytes 0 %   Abs Immature Granulocytes 0.02 0.00 - 0.07 K/uL    Imaging Studies: US OB Comp Less 14 Wks  Result Date: 07/14/2021 CLINICAL DATA:  No fetal heart tones on office ultrasound yesterday; assigned EGA [redacted] weeks 5 days EXAM: OBSTETRIC <14 WK ULTRASOUND TECHNIQUE: Transabdominal ultrasound was performed for evaluation of the gestation as well as the maternal uterus and adnexal regions. COMPARISON:  06/24/2021 FINDINGS: Intrauterine gestational sac: Present, single Yolk sac:  Not identified Embryo:  Present Cardiac Activity: Not identified Heart Rate: N/A bpm CRL:   67.3 mm   13 w 0 d                  [place] EDC: Subchorionic hemorrhage:  None visualized. Maternal uterus/adnexae: Posterior contraction. Anterior placenta, somewhat low lying and heterogeneous. LEFT ovary normal size and morphology, 2.9 x 4.2 x 2.5 cm. RIGHT ovary normal not visualized. No free pelvic fluid or adnexal masses. IMPRESSION: Intrauterine gestation again identified. No fetal cardiac activity is identified. Findings meet definitive criteria for failed pregnancy. This follows SRU consensus guidelines: Diagnostic Criteria for Nonviable Pregnancy Early in the First Trimester. [facility] Mis J Med [id number]. Electronically Signed   By: Ulyses Southward M.D.   On: 07/14/2021 13:54    Assessment: Miscarriage   Plan: Dilation & Evacuation -NPO -LR @ 125cc/hr -SCDs to OR -IV Doxy to OR -Risk/benefits and alternatives reviewed with the patient including but not limited to risk of bleeding, infection and injury to surrounding organs.  Questions and concerns were addressed and pt desires to proceed  Kellie Hidalgo, DO Attending Obstetrician & Gynecologist, [facility] for [facility], [facility]

## 2021-07-27 ENCOUNTER — Other Ambulatory Visit: Payer: Self-pay

## 2021-07-27 ENCOUNTER — Ambulatory Visit (HOSPITAL_BASED_OUTPATIENT_CLINIC_OR_DEPARTMENT_OTHER): Payer: Managed Care, Other (non HMO) | Admitting: Certified Registered"

## 2021-07-27 ENCOUNTER — Encounter (HOSPITAL_COMMUNITY)
Admission: RE | Disposition: A | Discharge status: Home / Self Care | Payer: Self-pay | Source: Home / Self Care | Attending: Obstetrics & Gynecology

## 2021-07-27 ENCOUNTER — Encounter (HOSPITAL_COMMUNITY): Payer: Self-pay | Admitting: Obstetrics & Gynecology

## 2021-07-27 ENCOUNTER — Ambulatory Visit (HOSPITAL_COMMUNITY): Payer: Managed Care, Other (non HMO) | Admitting: Certified Registered"

## 2021-07-27 ENCOUNTER — Ambulatory Visit (HOSPITAL_COMMUNITY)
Admission: RE | Admit: 2021-07-27 | Discharge: 2021-07-27 | Disposition: A | Discharge status: Home / Self Care | Payer: Managed Care, Other (non HMO) | Attending: Obstetrics & Gynecology | Admitting: Obstetrics & Gynecology

## 2021-07-27 ENCOUNTER — Telehealth: Payer: Self-pay | Admitting: Obstetrics & Gynecology

## 2021-07-27 DIAGNOSIS — D649 Anemia, unspecified: Secondary | ICD-10-CM | POA: Diagnosis not present

## 2021-07-27 DIAGNOSIS — D759 Disease of blood and blood-forming organs, unspecified: Secondary | ICD-10-CM | POA: Diagnosis not present

## 2021-07-27 DIAGNOSIS — I9581 Postprocedural hypotension: Secondary | ICD-10-CM | POA: Insufficient documentation

## 2021-07-27 DIAGNOSIS — O039 Complete or unspecified spontaneous abortion without complication: Secondary | ICD-10-CM | POA: Diagnosis not present

## 2021-07-27 DIAGNOSIS — O021 Missed abortion: Secondary | ICD-10-CM

## 2021-07-27 DIAGNOSIS — Z029 Encounter for administrative examinations, unspecified: Secondary | ICD-10-CM

## 2021-07-27 HISTORY — PX: DILATION AND EVACUATION: SHX1459

## 2021-07-27 LAB — CBC
HCT: 23.1 % — ABNORMAL LOW (ref 36.0–46.0)
HCT: 26.4 % — ABNORMAL LOW (ref 36.0–46.0)
Hemoglobin: 7.2 g/dL — ABNORMAL LOW (ref 12.0–15.0)
Hemoglobin: 8.9 g/dL — ABNORMAL LOW (ref 12.0–15.0)
MCH: 26.1 pg (ref 26.0–34.0)
MCH: 28.2 pg (ref 26.0–34.0)
MCHC: 31.2 g/dL (ref 30.0–36.0)
MCHC: 33.7 g/dL (ref 30.0–36.0)
MCV: 83.7 fL (ref 80.0–100.0)
MCV: 83.5 fL (ref 80.0–100.0)
Platelets: 346 10*3/uL (ref 150–400)
Platelets: 204 10*3/uL (ref 150–400)
RBC: 2.76 MIL/uL — ABNORMAL LOW (ref 3.87–5.11)
RBC: 3.16 MIL/uL — ABNORMAL LOW (ref 3.87–5.11)
RDW: 15.5 % (ref 11.5–15.5)
RDW: 15 % (ref 11.5–15.5)
WBC: 27.7 10*3/uL — ABNORMAL HIGH (ref 4.0–10.5)
WBC: 15.9 10*3/uL — ABNORMAL HIGH (ref 4.0–10.5)
nRBC: 0 % (ref 0.0–0.2)
nRBC: 0 % (ref 0.0–0.2)

## 2021-07-27 LAB — PREPARE RBC (CROSSMATCH)

## 2021-07-27 LAB — GLUCOSE, CAPILLARY: Glucose-Capillary: 166 mg/dL — ABNORMAL HIGH (ref 70–99)

## 2021-07-27 SURGERY — DILATION AND EVACUATION, UTERUS
Anesthesia: General | Site: Uterus

## 2021-07-27 MED ORDER — PHENYLEPHRINE HCL (PRESSORS) 10 MG/ML IV SOLN
INTRAVENOUS | Status: AC
  Filled 2021-07-27: qty 1

## 2021-07-27 MED ORDER — PROPOFOL 10 MG/ML IV BOLUS
INTRAVENOUS | Status: AC
  Filled 2021-07-27: qty 20

## 2021-07-27 MED ORDER — 0.9 % SODIUM CHLORIDE (POUR BTL) OPTIME
TOPICAL | Status: DC | PRN
  Administered 2021-07-27: 1000 mL

## 2021-07-27 MED ORDER — FENTANYL CITRATE PF 50 MCG/ML IJ SOSY
PREFILLED_SYRINGE | INTRAMUSCULAR | Status: AC
  Filled 2021-07-27: qty 1

## 2021-07-27 MED ORDER — PHENYLEPHRINE HCL-NACL 20-0.9 MG/250ML-% IV SOLN
INTRAVENOUS | Status: DC | PRN
  Administered 2021-07-27: 50 ug/min via INTRAVENOUS
  Administered 2021-07-27: 100 ug/min via INTRAVENOUS

## 2021-07-27 MED ORDER — METOCLOPRAMIDE HCL 5 MG/ML IJ SOLN
10.0000 mg | Freq: Once | INTRAMUSCULAR | Status: AC
Start: 2021-07-27 — End: 2021-07-27
  Administered 2021-07-27: 10 mg via INTRAVENOUS
  Filled 2021-07-27: qty 2

## 2021-07-27 MED ORDER — PHENYLEPHRINE HCL (PRESSORS) 10 MG/ML IV SOLN
INTRAVENOUS | Status: DC | PRN
  Administered 2021-07-27: 80 ug via INTRAVENOUS
  Administered 2021-07-27: 160 ug via INTRAVENOUS
  Administered 2021-07-27: 40 ug via INTRAVENOUS
  Administered 2021-07-27: 160 ug via INTRAVENOUS
  Administered 2021-07-27: 80 ug via INTRAVENOUS
  Administered 2021-07-27 (×2): 160 ug via INTRAVENOUS
  Administered 2021-07-27: 80 ug via INTRAVENOUS
  Administered 2021-07-27 (×2): 120 ug via INTRAVENOUS
  Administered 2021-07-27: 80 ug via INTRAVENOUS
  Administered 2021-07-27 (×2): 160 ug via INTRAVENOUS
  Administered 2021-07-27: 120 ug via INTRAVENOUS
  Administered 2021-07-27: 80 ug via INTRAVENOUS

## 2021-07-27 MED ORDER — SODIUM CHLORIDE 0.9 % IV BOLUS
500.0000 mL | Freq: Once | INTRAVENOUS | Status: AC
  Administered 2021-07-27: 500 mL via INTRAVENOUS

## 2021-07-27 MED ORDER — ONDANSETRON 4 MG PO TBDP: 4.0000 mg | ORAL_TABLET | Freq: Three times a day (TID) | ORAL | 0 refills | Status: AC | PRN

## 2021-07-27 MED ORDER — ALBUMIN HUMAN 25 % IV SOLN: 12.5000 g | Freq: Once | INTRAVENOUS | Status: DC

## 2021-07-27 MED ORDER — FENTANYL CITRATE (PF) 100 MCG/2ML IJ SOLN
INTRAMUSCULAR | Status: AC
  Filled 2021-07-27: qty 2

## 2021-07-27 MED ORDER — LACTATED RINGERS IV SOLN: INTRAVENOUS | Status: DC

## 2021-07-27 MED ORDER — LIDOCAINE-EPINEPHRINE 0.5 %-1:200000 IJ SOLN
INTRAMUSCULAR | Status: AC
  Filled 2021-07-27: qty 1

## 2021-07-27 MED ORDER — IBUPROFEN 600 MG PO TABS
600.0000 mg | ORAL_TABLET | Freq: Four times a day (QID) | ORAL | 0 refills | Status: AC | PRN
Start: 2021-07-27 — End: ?

## 2021-07-27 MED ORDER — MIDAZOLAM HCL 2 MG/2ML IJ SOLN
INTRAMUSCULAR | Status: AC
  Filled 2021-07-27: qty 2

## 2021-07-27 MED ORDER — ACETAMINOPHEN 10 MG/ML IV SOLN: 1000.0000 mg | Freq: Once | INTRAVENOUS | Status: DC

## 2021-07-27 MED ORDER — PROPOFOL 10 MG/ML IV BOLUS
INTRAVENOUS | Status: DC | PRN
  Administered 2021-07-27: 200 mg via INTRAVENOUS

## 2021-07-27 MED ORDER — ONDANSETRON HCL 4 MG/2ML IJ SOLN
INTRAMUSCULAR | Status: DC | PRN
  Administered 2021-07-27: 4 mg via INTRAVENOUS

## 2021-07-27 MED ORDER — ALBUMIN HUMAN 25 % IV SOLN
INTRAVENOUS | Status: AC
  Filled 2021-07-27: qty 50

## 2021-07-27 MED ORDER — SODIUM CHLORIDE 0.9% IV SOLUTION: Freq: Once | INTRAVENOUS | Status: DC

## 2021-07-27 MED ORDER — DEXAMETHASONE SODIUM PHOSPHATE 10 MG/ML IJ SOLN
INTRAMUSCULAR | Status: AC
  Filled 2021-07-27: qty 1

## 2021-07-27 MED ORDER — POVIDONE-IODINE 10 % EX SWAB: 2.0000 "application " | Freq: Once | CUTANEOUS | Status: DC

## 2021-07-27 MED ORDER — ONDANSETRON HCL 4 MG/2ML IJ SOLN: 4.0000 mg | Freq: Once | INTRAMUSCULAR | Status: DC | PRN

## 2021-07-27 MED ORDER — SODIUM CHLORIDE 0.9 % IV BOLUS
500.0000 mL | Freq: Once | INTRAVENOUS | Status: DC
Start: 2021-07-27 — End: 2021-07-27

## 2021-07-27 MED ORDER — ORAL CARE MOUTH RINSE: 15.0000 mL | Freq: Once | OROMUCOSAL | Status: AC

## 2021-07-27 MED ORDER — ALBUMIN HUMAN 25 % IV SOLN
INTRAVENOUS | Status: AC
Start: 2021-07-27 — End: ?
  Filled 2021-07-27: qty 50

## 2021-07-27 MED ORDER — HYDROMORPHONE HCL 1 MG/ML IJ SOLN: 0.2500 mg | INTRAMUSCULAR | Status: DC | PRN

## 2021-07-27 MED ORDER — OXYTOCIN 10 UNIT/ML IJ SOLN
INTRAMUSCULAR | Status: AC
  Filled 2021-07-27: qty 2

## 2021-07-27 MED ORDER — MIDAZOLAM HCL 2 MG/2ML IJ SOLN
INTRAMUSCULAR | Status: DC | PRN
  Administered 2021-07-27 (×2): 2 mg via INTRAVENOUS

## 2021-07-27 MED ORDER — LIDOCAINE-EPINEPHRINE 0.5 %-1:200000 IJ SOLN
INTRAMUSCULAR | Status: DC | PRN
  Administered 2021-07-27: 20 mL

## 2021-07-27 MED ORDER — ALBUMIN HUMAN 25 % IV SOLN
12.5000 g | INTRAVENOUS | Status: AC
  Administered 2021-07-27 (×2): 12.5 g via INTRAVENOUS

## 2021-07-27 MED ORDER — LIDOCAINE HCL (CARDIAC) PF 100 MG/5ML IV SOSY
PREFILLED_SYRINGE | INTRAVENOUS | Status: DC | PRN
Start: 2021-07-27 — End: 2021-07-27
  Administered 2021-07-27: 60 mg via INTRAVENOUS

## 2021-07-27 MED ORDER — CHLORHEXIDINE GLUCONATE 0.12 % MT SOLN
15.0000 mL | Freq: Once | OROMUCOSAL | Status: AC
  Administered 2021-07-27: 15 mL via OROMUCOSAL
  Filled 2021-07-27: qty 15

## 2021-07-27 MED ORDER — DEXMEDETOMIDINE (PRECEDEX) IN NS 20 MCG/5ML (4 MCG/ML) IV SYRINGE
PREFILLED_SYRINGE | INTRAVENOUS | Status: DC | PRN
  Administered 2021-07-27: 12 ug via INTRAVENOUS

## 2021-07-27 MED ORDER — PHENYLEPHRINE HCL-NACL 20-0.9 MG/250ML-% IV SOLN
0.0000 ug/min | INTRAVENOUS | Status: DC
  Administered 2021-07-27: 55.067 ug/min via INTRAVENOUS

## 2021-07-27 MED ORDER — MEPERIDINE HCL 50 MG/ML IJ SOLN: 6.2500 mg | INTRAMUSCULAR | Status: DC | PRN

## 2021-07-27 MED ORDER — FENTANYL CITRATE PF 50 MCG/ML IJ SOSY
50.0000 ug | PREFILLED_SYRINGE | INTRAMUSCULAR | Status: DC | PRN
  Administered 2021-07-27: 50 ug via INTRAVENOUS

## 2021-07-27 MED ORDER — DEXMEDETOMIDINE (PRECEDEX) IN NS 20 MCG/5ML (4 MCG/ML) IV SYRINGE
PREFILLED_SYRINGE | INTRAVENOUS | Status: AC
  Filled 2021-07-27: qty 5

## 2021-07-27 MED ORDER — FENTANYL CITRATE (PF) 250 MCG/5ML IJ SOLN
INTRAMUSCULAR | Status: DC | PRN
  Administered 2021-07-27 (×4): 25 ug via INTRAVENOUS

## 2021-07-27 MED ORDER — SODIUM CHLORIDE 0.9 % IV SOLN: 10.0000 mL/h | Freq: Once | INTRAVENOUS | Status: DC

## 2021-07-27 MED ORDER — DEXAMETHASONE SODIUM PHOSPHATE 10 MG/ML IJ SOLN
INTRAMUSCULAR | Status: DC | PRN
  Administered 2021-07-27: 10 mg via INTRAVENOUS

## 2021-07-27 SURGICAL SUPPLY — 26 items
CATH ROBINSON RED A/P 16FR (CATHETERS) ×2 IMPLANT
COVER PROBE U/S 5X48 (MISCELLANEOUS) ×1 IMPLANT
COVER SURGICAL LIGHT HANDLE (MISCELLANEOUS) ×4 IMPLANT
DECANTER SPIKE VIAL GLASS SM (MISCELLANEOUS) ×2 IMPLANT
FILTER UTR ASPR ASSEMBLY (MISCELLANEOUS) ×2 IMPLANT
GAUZE SPONGE 4X4 16PLY XRAY LF (GAUZE/BANDAGES/DRESSINGS) ×2 IMPLANT
GLOVE SURG ENC MOIS LTX SZ6.5 (GLOVE) ×2 IMPLANT
GLOVE SURG POLYISO LF SZ6.5 (GLOVE) ×1 IMPLANT
GLOVE SURG UNDER POLY LF SZ7 (GLOVE) ×4 IMPLANT
GOWN STRL REUS W/ TWL LRG LVL3 (GOWN DISPOSABLE) ×2 IMPLANT
GOWN STRL REUS W/TWL LRG LVL3 (GOWN DISPOSABLE) ×4
KIT BERKELEY 1ST TRIMESTER 3/8 (MISCELLANEOUS) ×2 IMPLANT
NS IRRIG 1000ML POUR BTL (IV SOLUTION) ×2 IMPLANT
PACK PERI GYN (CUSTOM PROCEDURE TRAY) ×2 IMPLANT
PAD ARMBOARD 7.5X6 YLW CONV (MISCELLANEOUS) ×2 IMPLANT
SET BASIN LINEN APH (SET/KITS/TRAYS/PACK) ×2 IMPLANT
SET BERKELEY SUCTION TUBING (SUCTIONS) ×2 IMPLANT
SUT VIC AB 3-0 SH 27 (SUTURE) ×2
SUT VIC AB 3-0 SH 27X BRD (SUTURE) IMPLANT
SUT VICRYL AB 3-0 BRD CT 36IN (SUTURE) ×1 IMPLANT
SYR CONTROL 10ML LL (SYRINGE) ×1 IMPLANT
TOWEL GREEN STERILE FF (TOWEL DISPOSABLE) ×4 IMPLANT
TRAP TISSUE FILTER (MISCELLANEOUS) ×7 IMPLANT
UNDERPAD 30X36 HEAVY ABSORB (UNDERPADS AND DIAPERS) ×2 IMPLANT
VACURETTE 10MM (CANNULA) ×1 IMPLANT
VACURETTE 12 RIGID CVD (CANNULA) ×1 IMPLANT

## 2021-07-27 NOTE — Progress Notes (Signed)
Immediately postoperative, pt was noted to be hypotensive requiring support.   Hgb 7.2 Pt given over 3L fluid  Suspect EBL was greater than recorded.  Consent obtained for transfusion of pRBC.  Pt was seen s/p 1st unit of pRBC.  She is alert and awake.  Skin color appropriate.  Still on BP support though in the process of weening.  Plan for 2nd unit.  If BP stabilizes and bleeding remains appropriate, plan for discharge home later today.  [name], DO Attending Obstetrician & Gynecologist, [facility] for [employer], [facility]

## 2021-07-27 NOTE — Transfer of Care (Signed)
Immediate Anesthesia Transfer of Care Note  Patient: Kellie Clark  Procedure(s) Performed: DILATATION AND EVACUATION (Uterus)  Patient Location: PACU  Anesthesia Type:General  Level of Consciousness: drowsy  Airway & Oxygen Therapy: Patient Spontanous Breathing and Patient connected to nasal cannula oxygen  Post-op Assessment: Report given to RN and Post -op Vital signs reviewed and stable  Post vital signs: Reviewed and stable  Last Vitals:  Vitals Value Taken Time  BP 101/54 07/27/21 0903  Temp 37.1 C 07/27/21 0900  Pulse 74 07/27/21 0906  Resp 15 07/27/21 0906  SpO2 99 % 07/27/21 0906  Vitals shown include unvalidated device data.  Last Pain:  Vitals:   07/27/21 0644  TempSrc: Oral  PainSc: 0-No pain         Complications: No notable events documented.

## 2021-07-27 NOTE — Anesthesia Preprocedure Evaluation (Signed)
Anesthesia Evaluation  Patient identified by MRN, date of birth, ID band Patient awake    Reviewed: Allergy & Precautions, NPO status , Patient's Chart, lab work & pertinent test results  Airway Mallampati: II  TM Distance: >3 FB Neck ROM: Full    Dental  (+) Dental Advisory Given, Teeth Intact   Pulmonary neg pulmonary ROS,    Pulmonary exam normal breath sounds clear to auscultation       Cardiovascular Exercise Tolerance: Good negative cardio ROS Normal cardiovascular exam Rhythm:Regular Rate:Normal     Neuro/Psych negative neurological ROS  negative psych ROS   GI/Hepatic Neg liver ROS, GERD  Controlled,  Endo/Other  negative endocrine ROS  Renal/GU negative Renal ROS  negative genitourinary   Musculoskeletal negative musculoskeletal ROS (+)   Abdominal   Peds negative pediatric ROS (+)  Hematology  (+) Blood dyscrasia, anemia ,   Anesthesia Other Findings   Reproductive/Obstetrics negative OB ROS                           Anesthesia Physical Anesthesia Plan  ASA: 2  Anesthesia Plan: General   Post-op Pain Management: Dilaudid IV   Induction: Intravenous  PONV Risk Score and Plan: 4 or greater and Ondansetron, Dexamethasone, Midazolam and Metaclopromide  Airway Management Planned: LMA  Additional Equipment:   Intra-op Plan:   Post-operative Plan: Extubation in OR  Informed Consent: I have reviewed the patients History and Physical, chart, labs and discussed the procedure including the risks, benefits and alternatives for the proposed anesthesia with the patient or authorized representative who has indicated his/her understanding and acceptance.     Dental advisory given  Plan Discussed with: CRNA and Surgeon  Anesthesia Plan Comments:       Anesthesia Quick Evaluation

## 2021-07-27 NOTE — Op Note (Signed)
Preoperative diagnosis: Missed AB  Postoperative diagnosis: Same  Anesthesia: General and paracervical block  Procedure: Dilatation and evacuation  Surgeon: Dr. [name]  Estimated blood loss: 200cc IVF: 1500cc  Procedure:  Pt was taken to the OR where general anesthesia was performed.  She was placed in lithotomy position.  She was prepped and draped in a sterile fashion.  The bladder was drained using a red rubber.  A weighted speculum is inserted in the vagina and the anterior lip of the cervix was grasped with a tenaculum forcep. We proceed with a paracervical block using 20 cc of 0.5% Lidocaine with epinephrine. The uterus was then sounded at 12 cm. The cervix was easily dilated using Hegar dilators.  Using a #12 curved cannula, we proceed with evacuation of products of conception without difficulty.  Sharp curettage was completed and products still appeared to be present.  Additional evacuation was performed.  Again sharp curettage of the uterine cavity was performed to confirm complete evacuation.  Instruments were then removed. Cervical laceration was noted and repaired with 3-0 vicryl in an interrupted fashion.  Instrument and sponge count was complete x2.    The procedure is very well tolerated by the patient is taken to recovery room in a well and stable condition.  Specimen: Products of conception sent to pathology   [name], DO Attending Obstetrician & Gynecologist, Faculty Practice Center for [facility], [facility]

## 2021-07-27 NOTE — Telephone Encounter (Signed)
Called the pt and lvm and tired sending my chart msg but that failed. But we rcvd fmla paper work. And the fee is $15 sent to [facility] to be completed.

## 2021-07-27 NOTE — Anesthesia Postprocedure Evaluation (Signed)
Anesthesia Post Note  Patient: KAELEI WHEELER  Procedure(s) Performed: DILATATION AND EVACUATION (Uterus)  Patient location during evaluation: PACU Anesthesia Type: General Level of consciousness: awake and alert and oriented Pain management: pain level controlled Vital Signs Assessment: post-procedure vital signs reviewed and stable Respiratory status: spontaneous breathing, nonlabored ventilation and respiratory function stable Cardiovascular status: blood pressure returned to baseline and stable Postop Assessment: no apparent nausea or vomiting Anesthetic complications: no Comments: Patient was hypotensive in PACU, Hb 7.2, received 3.5 liters of IV fluids and 2 units of PRBC. Vital signs stable.   No notable events documented.   Last Vitals:  Vitals:   07/27/21 1330 07/27/21 1400  BP: 114/82 120/83  Pulse: 92 99  Resp: 14 (!) 22  Temp: 37 C   SpO2: 100% 100%    Last Pain:  Vitals:   07/27/21 1400  TempSrc:   PainSc: 0-No pain                 Jozey Janco C Feliz [place]

## 2021-07-27 NOTE — Discharge Instructions (Signed)
HOME INSTRUCTIONS  Please note any unusual or excessive bleeding, pain, swelling. Mild dizziness or drowsiness are normal for about 24 hours after surgery.   Shower when comfortable  Restrictions: No driving for 24 hours or while taking pain medications.  Activity:  No heavy lifting (> 10 lbs), nothing in vagina (no tampons, douching, or intercourse) x 4 weeks; no tub baths for 4 weeks Vaginal spotting is expected but if your bleeding is heavy, period like,  please call the office  Diet:  You may return to your regular diet.  Do not eat large meals.  Eat small frequent meals throughout the day.  Continue to drink a good amount of water at least 6-8 glasses of water per day, hydration is very important for the healing process.  Pain Management: Take Ibuprofen every 6 hours with food as needed for pain.  You can also take over the counter tylenol   Be sure to drink plenty of fluids and increase your fiber to help with constipation.  Alcohol -- Avoid for 24 hours and while taking pain medications.  Nausea: Take sips of ginger ale or soda  Fever -- Call physician if temperature over 101 degrees  Follow up:  If you do not already have a follow up appointment scheduled, please call the office at [phone].  If you experience fever (a temperature greater than 100.4), pain unrelieved by pain medication, shortness of breath, swelling of a single leg, or any other symptoms which are concerning to you please the office immediately.

## 2021-07-27 NOTE — Anesthesia Procedure Notes (Signed)
Procedure Name: LMA Insertion Date/Time: 07/27/2021 7:37 AM Performed by: Lorin Glass, CRNA Pre-anesthesia Checklist: Emergency Drugs available, Suction available and Patient being monitored Patient Re-evaluated:Patient Re-evaluated prior to induction Oxygen Delivery Method: Circle system utilized Preoxygenation: Pre-oxygenation with 100% oxygen Induction Type: IV induction LMA: LMA inserted LMA Size: 4.0 Number of attempts: 1 Placement Confirmation: positive ETCO2 and breath sounds checked- equal and bilateral Tube secured with: Tape Dental Injury: Teeth and Oropharynx as per pre-operative assessment

## 2021-07-28 LAB — TYPE AND SCREEN
ABO/RH(D): AB POS
Antibody Screen: NEGATIVE
Unit division: 0
Unit division: 0

## 2021-07-28 LAB — BPAM RBC
Blood Product Expiration Date: 202303052359
Blood Product Expiration Date: 202303052359
ISSUE DATE / TIME: 202302211037
ISSUE DATE / TIME: 202302211216
Unit Type and Rh: 600
Unit Type and Rh: 600

## 2021-07-28 LAB — SURGICAL PATHOLOGY

## 2021-07-29 ENCOUNTER — Encounter (HOSPITAL_COMMUNITY): Payer: Self-pay | Admitting: Obstetrics & Gynecology

## 2021-08-04 ENCOUNTER — Ambulatory Visit (INDEPENDENT_AMBULATORY_CARE_PROVIDER_SITE_OTHER): Payer: Managed Care, Other (non HMO) | Admitting: Women's Health

## 2021-08-04 ENCOUNTER — Other Ambulatory Visit: Payer: Self-pay

## 2021-08-04 ENCOUNTER — Encounter: Payer: Self-pay | Admitting: Women's Health

## 2021-08-04 ENCOUNTER — Other Ambulatory Visit (HOSPITAL_COMMUNITY)
Admission: RE | Admit: 2021-08-04 | Discharge: 2021-08-04 | Disposition: A | Discharge status: Home / Self Care | Payer: Managed Care, Other (non HMO) | Source: Ambulatory Visit | Attending: Women's Health | Admitting: Women's Health

## 2021-08-04 VITALS — BP 125/86 | HR 92 | Ht 64.0 in | Wt 214.0 lb

## 2021-08-04 DIAGNOSIS — Z4889 Encounter for other specified surgical aftercare: Secondary | ICD-10-CM

## 2021-08-04 DIAGNOSIS — Z124 Encounter for screening for malignant neoplasm of cervix: Secondary | ICD-10-CM | POA: Diagnosis not present

## 2021-08-04 DIAGNOSIS — D649 Anemia, unspecified: Secondary | ICD-10-CM | POA: Diagnosis not present

## 2021-08-04 DIAGNOSIS — O021 Missed abortion: Secondary | ICD-10-CM

## 2021-08-04 LAB — POCT HEMOGLOBIN: Hemoglobin: 9.1 g/dL — AB (ref 11–14.6)

## 2021-08-04 NOTE — Progress Notes (Signed)
? ?  GYN VISIT ?Patient name: Kellie Clark MRN [id number]  Date of birth: [birth year] ?Chief Complaint:   ?Routine Post Op ? ?History of Present Illness:   ?Kellie Clark is a 36 y.o. [phone] African-American female 8d s/p D&E for missed ab being seen today for post op. She required PRBC d/t symptomatic acute blood loss. Admission hgb was 11.1, down to 7.2, then 8.9 s/p transfusion. Is currently taking pnv. No dizziness or lightheadedness in last few days. Does plan another pregnancy soon.  ?No LMP recorded. ?Last pap 2013. Results were:  neg ? ?Depression screen [facility] 2/9 07/12/2021 06/15/2021  ?Decreased Interest 1 1  ?Down, Depressed, Hopeless 0 0  ?PHQ - 2 Score 1 1  ?Altered sleeping 1 1  ?Tired, decreased energy 1 1  ?Change in appetite 1 1  ?Feeling bad or failure about yourself  0 0  ?Trouble concentrating 0 1  ?Moving slowly or fidgety/restless 0 0  ?Suicidal thoughts 0 0  ?PHQ-9 Score 4 5  ? ?  ?GAD 7 : Generalized Anxiety Score 07/12/2021 06/15/2021  ?Nervous, Anxious, on Edge 0 0  ?Control/stop worrying 0 0  ?Worry too much - different things 0 0  ?Trouble relaxing - 1  ?Restless 0 0  ?Easily annoyed or irritable 1 1  ?Afraid - awful might happen 0 0  ?Total GAD 7 Score - 2  ? ? ? ?Review of Systems:   ?Pertinent items are noted in HPI ?Denies fever/chills, dizziness, headaches, visual disturbances, fatigue, shortness of breath, chest pain, abdominal pain, vomiting, abnormal vaginal discharge/itching/odor/irritation, problems with periods, bowel movements, urination, or intercourse unless otherwise stated above.  ?Pertinent History Reviewed:  ?Reviewed past medical,surgical, social, obstetrical and family history.  ?Reviewed problem list, medications and allergies. ?Physical Assessment:  ? ?Vitals:  ? 08/04/21 1210  ?BP: 125/86  ?Pulse: 92  ?Weight: 214 lb (97.1 kg)  ?Height: 5\' 4"  (1.626 m)  ?Body mass index is 36.73 kg/m?. ? ?     Physical Examination:  ? General appearance: alert, well appearing, and in  no distress ? Mental status: alert, oriented to person, place, and time ? Skin: warm & dry  ? Cardiovascular: normal heart rate noted ? Respiratory: normal respiratory effort, no distress ? Abdomen: soft, non-tender  ? Pelvic: VULVA: normal appearing vulva with no masses, tenderness or lesions, VAGINA: normal appearing vagina with normal color and discharge, no lesions, CERVIX: normal appearing cervix without discharge or lesions, sutures present from repair of cervical lac. Thin prep pap obtained ? Extremities: no edema  ? ?Chaperone: Levy Pupa   ? ?Hgb fingerstick: 9.1 ? ?No results found for this or any previous visit (from the past 24 hour(s)).  ?Assessment & Plan:  ?1) 8d s/p D&E for missed ab> doing well, keep appt on 3/9 as scheduled ? ?2) Cervical cancer screening> pap today ? ?3) Anemia> improving s/p PRBC ? ?Meds: No orders of the defined types were placed in this encounter. ? ? ?Orders Placed This Encounter  ?Procedures  ? POCT hemoglobin  ? ? ?Return for 3/9 as scheduled. ? ?Roma Schanz CNM, WHNP-BC ?08/04/2021 ?12:54 PM   ?

## 2021-08-06 LAB — CYTOLOGY - PAP
Chlamydia: NEGATIVE
Comment: NEGATIVE
Comment: NEGATIVE
Comment: NORMAL
Diagnosis: UNDETERMINED — AB
High risk HPV: NEGATIVE
Neisseria Gonorrhea: NEGATIVE

## 2021-08-09 ENCOUNTER — Encounter: Payer: Self-pay | Admitting: Women's Health

## 2021-08-09 DIAGNOSIS — R87619 Unspecified abnormal cytological findings in specimens from cervix uteri: Secondary | ICD-10-CM | POA: Insufficient documentation

## 2021-08-12 ENCOUNTER — Ambulatory Visit (INDEPENDENT_AMBULATORY_CARE_PROVIDER_SITE_OTHER): Payer: Managed Care, Other (non HMO) | Admitting: Obstetrics & Gynecology

## 2021-08-12 ENCOUNTER — Other Ambulatory Visit: Payer: Self-pay

## 2021-08-12 ENCOUNTER — Encounter: Payer: Self-pay | Admitting: Obstetrics & Gynecology

## 2021-08-12 VITALS — BP 124/82 | HR 86 | Ht 64.0 in | Wt 216.8 lb

## 2021-08-12 DIAGNOSIS — O039 Complete or unspecified spontaneous abortion without complication: Secondary | ICD-10-CM

## 2021-08-12 NOTE — Progress Notes (Signed)
? ?  GYN VISIT ?Patient name: Kellie Clark MRN [id number]  Date of birth: 22-Jan-1986 ?Chief Complaint:   ?Routine Post Op ? ?History of Present Illness:   ?Kellie Clark is a 36 y.o. G46P2012  female s/p D&E complicated by hemorrhage- s/p 2upRBC. ? ?Today reports no acute compalints.  Still some light brown discharge, but minimal. Denies headache or dizziness.  Denies F/C/CP/SOB.  Occasional cramp when sitting for prolonged period, but denies pelvic or abdominal pain.  Overall doing well and no acute complaints. ? ?Not sure about future pregnancy- may consider, but not sure. ? ?No LMP recorded. ? ?Depression screen [facility] 2/9 07/12/2021 06/15/2021  ?Decreased Interest 1 1  ?Down, Depressed, Hopeless 0 0  ?PHQ - 2 Score 1 1  ?Altered sleeping 1 1  ?Tired, decreased energy 1 1  ?Change in appetite 1 1  ?Feeling bad or failure about yourself  0 0  ?Trouble concentrating 0 1  ?Moving slowly or fidgety/restless 0 0  ?Suicidal thoughts 0 0  ?PHQ-9 Score 4 5  ? ? ? ?Review of Systems:   ?Pertinent items are noted in HPI ?Denies fever/chills, dizziness, headaches, visual disturbances, fatigue, shortness of breath, chest pain, abdominal pain, vomiting, no problems with periods, bowel movements, urination, or intercourse unless otherwise stated above.  ?Pertinent History Reviewed:  ?Reviewed past medical,surgical, social, obstetrical and family history.  ?Reviewed problem list, medications and allergies. ?Physical Assessment:  ? ?Vitals:  ? 08/12/21 1600  ?BP: 124/82  ?Pulse: 86  ?Weight: 216 lb 12.8 oz (98.3 kg)  ?Height: 5\' 4"  (1.626 m)  ?Body mass index is 37.21 kg/m?. ? ?     Physical Examination:  ? General appearance: alert, well appearing, and in no distress ? Psych: mood appropriate, normal affect ? Skin: warm & dry  ? Cardiovascular: normal heart rate noted ? Respiratory: normal respiratory effort, no distress ? Abdomen: soft, non-tender  ? Pelvic: examination not indicated ? Extremities: no edema  ? ?Chaperone: N/A    ? ?Assessment & Plan:  ?1) s/p D&E with hemorrhage ?-bleeding appropriate ?-meeting postop milestones appropriately ? ?2) Contraceptive management ?-reviewed options ?-for now plan on condoms ?-continue with PNV daily ? ? ?Return in about 1 year (around 08/13/2022) for Annual. ? ? ?10/13/2022, DO ?Attending Obstetrician & Gynecologist, Faculty Practice ?Center for [name], [facility] ? ? ? ?

## 2022-01-06 ENCOUNTER — Ambulatory Visit (INDEPENDENT_AMBULATORY_CARE_PROVIDER_SITE_OTHER): Payer: Managed Care, Other (non HMO) | Admitting: *Deleted

## 2022-01-06 ENCOUNTER — Encounter: Payer: Self-pay | Admitting: *Deleted

## 2022-01-06 DIAGNOSIS — Z3201 Encounter for pregnancy test, result positive: Secondary | ICD-10-CM

## 2022-01-06 LAB — POCT URINE PREGNANCY: Preg Test, Ur: POSITIVE — AB

## 2022-01-06 NOTE — Progress Notes (Signed)
   NURSE VISIT- PREGNANCY CONFIRMATION   SUBJECTIVE:  Kellie Clark is a 36 y.o. G42P2012 female at Unknown by uncertain LMP of No LMP recorded. Here for pregnancy confirmation.  Home pregnancy test: positive x 2   She reports no complaints.  She is not taking prenatal vitamins.    OBJECTIVE:  There were no vitals taken for this visit.  Appears well, in no apparent distress  Results for orders placed or performed in visit on 01/06/22 (from the past 24 hour(s))  POCT urine pregnancy   Collection Time: 01/06/22  4:08 PM  Result Value Ref Range   Preg Test, Ur Positive (A) Negative    ASSESSMENT: Positive pregnancy test, Unknown by LMP    PLAN: Schedule for dating ultrasound in pending hcg level  Prenatal vitamins: plans to begin OTC ASAP   Nausea medicines: not currently needed   OB packet given: Yes  Annamarie Dawley  01/06/2022 4:10 PM

## 2022-01-07 LAB — BETA HCG QUANT (REF LAB): hCG Quant: 298 m[IU]/mL

## 2022-01-15 ENCOUNTER — Emergency Department (HOSPITAL_COMMUNITY)
Admission: EM | Admit: 2022-01-15 | Discharge: 2022-01-15 | Disposition: A | Discharge status: Home / Self Care | Payer: Managed Care, Other (non HMO) | Attending: Emergency Medicine | Admitting: Emergency Medicine

## 2022-01-15 ENCOUNTER — Encounter (HOSPITAL_COMMUNITY): Payer: Self-pay

## 2022-01-15 ENCOUNTER — Emergency Department (HOSPITAL_COMMUNITY): Payer: Managed Care, Other (non HMO)

## 2022-01-15 ENCOUNTER — Other Ambulatory Visit: Payer: Self-pay

## 2022-01-15 DIAGNOSIS — Z3A01 Less than 8 weeks gestation of pregnancy: Secondary | ICD-10-CM | POA: Diagnosis not present

## 2022-01-15 DIAGNOSIS — O209 Hemorrhage in early pregnancy, unspecified: Secondary | ICD-10-CM | POA: Insufficient documentation

## 2022-01-15 DIAGNOSIS — O4690 Antepartum hemorrhage, unspecified, unspecified trimester: Secondary | ICD-10-CM

## 2022-01-15 LAB — URINALYSIS, ROUTINE W REFLEX MICROSCOPIC
Bilirubin Urine: NEGATIVE
Glucose, UA: NEGATIVE mg/dL
Ketones, ur: NEGATIVE mg/dL
Leukocytes,Ua: NEGATIVE
Nitrite: NEGATIVE
Protein, ur: NEGATIVE mg/dL
Specific Gravity, Urine: 1.016 (ref 1.005–1.030)
pH: 5 (ref 5.0–8.0)

## 2022-01-15 LAB — CBC WITH DIFFERENTIAL/PLATELET
Abs Immature Granulocytes: 0.01 10*3/uL (ref 0.00–0.07)
Basophils Absolute: 0 10*3/uL (ref 0.0–0.1)
Basophils Relative: 0 %
Eosinophils Absolute: 0.1 10*3/uL (ref 0.0–0.5)
Eosinophils Relative: 2 %
HCT: 30.5 % — ABNORMAL LOW (ref 36.0–46.0)
Hemoglobin: 9.3 g/dL — ABNORMAL LOW (ref 12.0–15.0)
Immature Granulocytes: 0 %
Lymphocytes Relative: 35 %
Lymphs Abs: 1.7 10*3/uL (ref 0.7–4.0)
MCH: 22.2 pg — ABNORMAL LOW (ref 26.0–34.0)
MCHC: 30.5 g/dL (ref 30.0–36.0)
MCV: 72.8 fL — ABNORMAL LOW (ref 80.0–100.0)
Monocytes Absolute: 0.3 10*3/uL (ref 0.1–1.0)
Monocytes Relative: 7 %
Neutro Abs: 2.7 10*3/uL (ref 1.7–7.7)
Neutrophils Relative %: 56 %
Platelets: 294 10*3/uL (ref 150–400)
RBC: 4.19 MIL/uL (ref 3.87–5.11)
RDW: 21.2 % — ABNORMAL HIGH (ref 11.5–15.5)
WBC: 4.9 10*3/uL (ref 4.0–10.5)
nRBC: 0 % (ref 0.0–0.2)

## 2022-01-15 LAB — BASIC METABOLIC PANEL
Anion gap: 7 (ref 5–15)
BUN: 7 mg/dL (ref 6–20)
CO2: 22 mmol/L (ref 22–32)
Calcium: 8.8 mg/dL — ABNORMAL LOW (ref 8.9–10.3)
Chloride: 106 mmol/L (ref 98–111)
Creatinine, Ser: 0.65 mg/dL (ref 0.44–1.00)
GFR, Estimated: >60 mL/min (ref >60)
Glucose, Bld: 81 mg/dL (ref 70–99)
Potassium: 3.8 mmol/L (ref 3.5–5.1)
Sodium: 135 mmol/L (ref 135–145)

## 2022-01-15 LAB — HCG, QUANTITATIVE, PREGNANCY: hCG, Beta Chain, Quant, S: 1050 m[IU]/mL — ABNORMAL HIGH (ref <5)

## 2022-01-15 LAB — ABO/RH: ABO/RH(D): AB POS

## 2022-01-15 NOTE — ED Provider Notes (Signed)
[facility] EMERGENCY DEPARTMENT Provider Note   CSN: [id number] Arrival date & time: 01/15/22  1356     History  Chief Complaint  Patient presents with   Vaginal Bleeding    Kellie Clark is a 36 y.o. female.   Vaginal Bleeding Associated symptoms: abdominal pain and back pain   Associated symptoms: no dizziness, no dysuria, no fever and no nausea        Kellie Clark is a 36 y.o. female G4P2 AB1 approximately 5 to [redacted] weeks pregnant by estimation.  Last LMP unknown.  She has history of anemia, currently taking iron.  She presents to the Emergency Department complaining of lower abdominal cramping and vaginal bleeding.  Symptoms started yesterday.  She noticed some cramping of her lower back and she had a small amount of dark blackish blood from her vagina.  This morning, states the bleeding has slightly increased and now pinkish.  In color.  She is now having cramping of her lower abdomen as well as her back.  Was seen by OB/GYN last week and had pregnancy test confirmed.  No recent ultrasound.  Had history of miscarriage in March of this year.  She denies any fever, dysuria, nausea vomiting or diarrhea.  Home Medications Prior to Admission medications   Medication Sig Start Date End Date Taking? Authorizing Provider  diphenhydrAMINE (BENADRYL) 25 MG tablet Take 25 mg by mouth daily as needed for allergies. Patient not taking: Reported on 01/06/2022    [provider]  ibuprofen (ADVIL) 600 MG tablet Take 1 tablet (600 mg total) by mouth every 6 (six) hours as needed. Patient not taking: Reported on 01/06/2022 07/27/21   [name], DO  ondansetron (ZOFRAN-ODT) 4 MG disintegrating tablet Take 1 tablet (4 mg total) by mouth every 8 (eight) hours as needed for nausea or vomiting. Patient not taking: Reported on 08/12/2021 07/27/21   [name], DO  Prenatal Vit-Fe Fumarate-FA (PRENATAL VITAMIN PO) Take by mouth.    [provider]  promethazine (PHENERGAN)  25 MG tablet Take 1 tablet (25 mg total) by mouth every 6 (six) hours as needed for nausea or vomiting. Patient not taking: Reported on 08/12/2021 06/15/21   Adline Potter, NP      Allergies    Ceclor [cefaclor]    Review of Systems   Review of Systems  Constitutional:  Negative for chills and fever.  Respiratory:  Negative for shortness of breath.   Cardiovascular:  Negative for chest pain.  Gastrointestinal:  Positive for abdominal pain. Negative for constipation, diarrhea, nausea and vomiting.  Genitourinary:  Positive for vaginal bleeding. Negative for difficulty urinating, dysuria and flank pain.  Musculoskeletal:  Positive for back pain.  Neurological:  Negative for dizziness, syncope and weakness.    Physical Exam Updated Vital Signs BP 124/75 (BP Location: Left Arm)   Pulse 86   Temp 98 F (36.7 C) (Oral)   Resp 18   Ht 5\' 4"  (1.626 m)   Wt 98.4 kg   LMP  (LMP Unknown)   SpO2 100%   BMI 37.25 kg/m  Physical Exam Vitals and nursing note reviewed. Exam conducted with a chaperone present.  Constitutional:      General: She is not in acute distress.    Appearance: Normal appearance.  Cardiovascular:     Rate and Rhythm: Normal rate and regular rhythm.     Pulses: Normal pulses.  Pulmonary:     Effort: Pulmonary effort is normal.  Abdominal:  General: There is no distension.     Palpations: Abdomen is soft.     Tenderness: There is no abdominal tenderness. There is no right CVA tenderness, left CVA tenderness or guarding.  Genitourinary:    Vagina: No bleeding.     Cervix: No cervical motion tenderness or cervical bleeding.     Uterus: Not enlarged and not tender.      Adnexa:        Right: No mass or tenderness.         Left: No mass or tenderness.       Comments: Small amount of blood in the vaginal vault.  Cervical os is closed.  No palpable enlargement of the uterus.  No palpable adnexal masses or tenderness on exam.  Exam is somewhat limited due to  body habitus. Musculoskeletal:        General: Normal range of motion.     Right lower leg: No edema.     Left lower leg: No edema.  Skin:    General: Skin is warm.     Capillary Refill: Capillary refill takes less than 2 seconds.     Findings: No rash.  Neurological:     General: No focal deficit present.     Mental Status: She is alert.     Sensory: No sensory deficit.     Motor: No weakness.     ED Results / Procedures / Treatments   Labs (all labs ordered are listed, but only abnormal results are displayed) Labs Reviewed  HCG, QUANTITATIVE, PREGNANCY - Abnormal; Notable for the following components:      Result Value   hCG, Beta Chain, Quant, S 1,050 (*)    All other components within normal limits  CBC WITH DIFFERENTIAL/PLATELET - Abnormal; Notable for the following components:   Hemoglobin 9.3 (*)    HCT 30.5 (*)    MCV 72.8 (*)    MCH 22.2 (*)    RDW 21.2 (*)    All other components within normal limits  BASIC METABOLIC PANEL - Abnormal; Notable for the following components:   Calcium 8.8 (*)    All other components within normal limits  URINALYSIS, ROUTINE W REFLEX MICROSCOPIC - Abnormal; Notable for the following components:   Hgb urine dipstick LARGE (*)    Bacteria, UA RARE (*)    All other components within normal limits  ABO/RH  GC/CHLAMYDIA PROBE AMP ([place]) NOT AT [facility]    EKG None  Radiology US OB LESS THAN 14 WEEKS WITH OB TRANSVAGINAL  Result Date: 01/15/2022 CLINICAL DATA:  Pelvic pain, vaginal bleeding EXAM: OBSTETRIC <14 WK [place] AND TRANSVAGINAL OB US TECHNIQUE: Both transabdominal and transvaginal ultrasound examinations were performed for complete evaluation of the gestation as well as the maternal uterus, adnexal regions, and pelvic cul-de-sac. Transvaginal technique was performed to assess early pregnancy. COMPARISON:  None Available. FINDINGS: Intrauterine gestational sac: Single Yolk sac:  Not seen Embryo:  Not seen Cardiac Activity: Not  seen MSD: 3.8 mm   5 w   1 d Subchorionic hemorrhage:  None visualized. Maternal uterus/adnexae: Unremarkable. IMPRESSION: There is 3.8 mm fluid collection within the fundus of the uterus without demonstrable yolk sac or fetal pole. Differential diagnostic possibilities would include very early IUP or failed gestation or ectopic gestation with decidual reaction in the uterus. Serial HCG estimations and follow-up sonogram in 1-2 weeks may be considered. There are no adnexal masses.  There is no free fluid in pelvis. Electronically Signed  By: Ernie Avena M.D.   On: 01/15/2022 17:26    Procedures Procedures    Medications Ordered in ED Medications - No data to display  ED Course/ Medical Decision Making/ A&P                           Medical Decision Making Patient here for evaluation of low back and lower abdominal cramping, vaginal bleeding and early pregnancy.  Uncertain LMP, but believes she is approximately 5 to [redacted] weeks gestation.  [facility] had confirmed pregnancy test with quant of 298.  Began having cramping and vaginal bleeding last evening.  Slightly heavier today.  On exam, patient is well-appearing nontoxic.  She has reassuring vital signs.  On pelvic exam, she does have small amount of blood in the vaginal vault without obvious clotting.  Cervical os appears closed.  Differential diagnosis at this time would include but not limited to ectopic pregnancy, early pregnancy, threatened miscarriage  She will need ultrasound to further evaluate for possible ectopic  Amount and/or Complexity of Data Reviewed Labs: ordered.    Details: Labs interpreted by me no evidence of leukocytosis.  Hemoglobin 9.3, this is near baseline, 9.13 months ago.  She is AB+, chemistries unremarkable.  Urinalysis without evidence of infection.  Quantitative hCG today of 1050. Radiology: ordered.    Details: OB ultrasound with transvaginal obtained for further evaluation of possible  ectopic pregnancy.  Results showed a 3.8 mm fluid collection in the fundus without a demonstratable yolk sac or fetal pole.  Serial hCGs are recommended and follow-up sonogram in 1 to 2 weeks Discussion of management or test interpretation with external provider(s): Discussed ultrasound and lab findings with the patient.  Discussed possible early pregnancy versus threatened AB versus ectopic.  She verbalized understanding, she is agreeable to close outpatient follow-up with [facility].  Recommended Tylenol if needed for pain or discomfort.  Return precautions were discussed           Final Clinical Impression(s) / ED Diagnoses Final diagnoses:  Vaginal bleeding in pregnancy    Rx / DC Orders ED Discharge Orders     None         Pauline Aus, PA-C 01/15/22 1757    Long, Arlyss Repress, MD 01/16/22 1811

## 2022-01-15 NOTE — ED Triage Notes (Signed)
Pt seen OB/GYN last week and pregnancy confirmed and est 5-7 weeks, started spotting on Friday, brown in color. Today bleeding heavier and turned pink to red. Lower back pain and cramping. Hx of miscarriage back in March this year.

## 2022-01-15 NOTE — Discharge Instructions (Signed)
As discussed, you will need repeat hCG levels.  Please call [facility] on Monday to arrange follow-up appointment.  Repeat ultrasound recommended in 1 to 2 weeks.  You may take Tylenol if needed for discomfort.  Return to the emergency department for any new or worsening symptoms.

## 2022-01-17 ENCOUNTER — Encounter: Payer: Self-pay | Admitting: Women's Health

## 2022-01-17 ENCOUNTER — Telehealth: Payer: Self-pay | Admitting: Adult Health

## 2022-01-17 ENCOUNTER — Other Ambulatory Visit: Payer: Managed Care, Other (non HMO)

## 2022-01-17 LAB — GC/CHLAMYDIA PROBE AMP (~~LOC~~) NOT AT ARMC
Chlamydia: NEGATIVE
Comment: NEGATIVE
Comment: NORMAL
Neisseria Gonorrhea: NEGATIVE

## 2022-01-17 NOTE — Telephone Encounter (Signed)
Patient is calling stating that she is still bleeding and she had went to the ED yesterday do we need to order hcg levels before bring her in

## 2022-01-18 ENCOUNTER — Other Ambulatory Visit: Payer: Self-pay | Admitting: Adult Health

## 2022-01-18 DIAGNOSIS — O039 Complete or unspecified spontaneous abortion without complication: Secondary | ICD-10-CM

## 2022-01-18 LAB — BETA HCG QUANT (REF LAB): hCG Quant: 655 m[IU]/mL

## 2022-01-19 ENCOUNTER — Other Ambulatory Visit: Payer: Managed Care, Other (non HMO)

## 2022-01-20 LAB — BETA HCG QUANT (REF LAB): hCG Quant: 714 m[IU]/mL

## 2022-01-28 ENCOUNTER — Other Ambulatory Visit: Payer: Self-pay

## 2022-01-28 DIAGNOSIS — O3680X0 Pregnancy with inconclusive fetal viability, not applicable or unspecified: Secondary | ICD-10-CM

## 2022-02-01 ENCOUNTER — Encounter: Payer: Self-pay | Admitting: Advanced Practice Midwife

## 2022-02-01 ENCOUNTER — Ambulatory Visit (INDEPENDENT_AMBULATORY_CARE_PROVIDER_SITE_OTHER): Payer: Managed Care, Other (non HMO)

## 2022-02-01 ENCOUNTER — Ambulatory Visit (INDEPENDENT_AMBULATORY_CARE_PROVIDER_SITE_OTHER): Payer: Managed Care, Other (non HMO) | Admitting: Advanced Practice Midwife

## 2022-02-01 DIAGNOSIS — O3680X0 Pregnancy with inconclusive fetal viability, not applicable or unspecified: Secondary | ICD-10-CM

## 2022-02-01 DIAGNOSIS — O0281 Inappropriate change in quantitative human chorionic gonadotropin (hCG) in early pregnancy: Secondary | ICD-10-CM

## 2022-02-01 DIAGNOSIS — Z029 Encounter for administrative examinations, unspecified: Secondary | ICD-10-CM

## 2022-02-01 NOTE — Progress Notes (Signed)
Ultrasounds Results Note  SUBJECTIVE HPI:  [name] is a 36 y.o. [id number] at Unknown GA who presents to [facility] for followup ultrasound results. The patient denies abdominal pain or vaginal bleeding today.  Upon review of the patient's records, patient was first seen in ED on 01/06/22 for pain and bleeding.     Previous HCG's  Latest Reference Range & Units 01/06/22 16:16 01/15/22 14:43 01/17/22 16:24 01/19/22 10:23  hCG Quant mIU/mL 298  655 714  HCG, Beta Chain, Quant, S <5 mIU/mL  1,050 (H)    (H): Data is abnormally high  Previous US [place] 8/12: 0.4 cm GS no YS or FP  Repeat ultrasound was performed earlier today.   Past Medical History:  Diagnosis Date  . Anemia   . Gallbladder disease    x2 weeks starting 1 week postpartum with G1  . GERD (gastroesophageal reflux disease)    Past Surgical History:  Procedure Laterality Date  . DILATION AND EVACUATION N/A 07/27/2021   Procedure: DILATATION AND EVACUATION;  Surgeon: [name], DO;  Location: [facility];  Service: Gynecology;  Laterality: N/A;   Social History   Socioeconomic History  . Marital status: Married    Spouse name: Not on file  . Number of children: Not on file  . Years of education: Not on file  . Highest education level: Not on file  Occupational History  . Not on file  Tobacco Use  . Smoking status: Never  . Smokeless tobacco: Never  Vaping Use  . Vaping Use: Never used  Substance and Sexual Activity  . Alcohol use: No  . Drug use: No  . Sexual activity: Not Currently    Birth control/protection: None  Other Topics Concern  . Not on file  Social History Narrative  . Not on file   Social Determinants of Health   Financial Resource Strain: Low Risk  (06/15/2021)   Overall Financial Resource Strain (CARDIA)   . Difficulty of Paying Living Expenses: Not very hard  Food Insecurity: Food Insecurity Present (06/15/2021)   Hunger Vital Sign   . Worried About Brewing technologist in the Last Year: Sometimes true   . Ran Out of Food in the Last Year: Sometimes true  Transportation Needs: No Transportation Needs (06/15/2021)   PRAPARE - Transportation   . Lack of Transportation (Medical): No   . Lack of Transportation (Non-Medical): No  Physical Activity: Insufficiently Active (06/15/2021)   Exercise Vital Sign   . Days of Exercise per Week: 1 day   . Minutes of Exercise per Session: 10 min  Stress: No Stress Concern Present (06/15/2021)   [employer] of Occupational Health - Occupational Stress Questionnaire   . Feeling of Stress : Only a little  Social Connections: Moderately Integrated (06/15/2021)   Social Connection and Isolation Panel [NHANES]   . Frequency of Communication with Friends and Family: More than three times a week   . Frequency of Social Gatherings with Friends and Family: Twice a week   . Attends Religious Services: More than 4 times per year   . Active Member of Clubs or Organizations: No   . Attends Banker Meetings: Never   . Marital Status: Married  Catering manager Violence: Not At Risk (06/15/2021)   Humiliation, Afraid, Rape, and Kick questionnaire   . Fear of Current or Ex-Partner: No   . Emotionally Abused: No   . Physically Abused: No   . Sexually Abused: No  Current Outpatient Medications on File Prior to Visit  Medication Sig Dispense Refill  . diphenhydrAMINE (BENADRYL) 25 MG tablet Take 25 mg by mouth daily as needed for allergies. (Patient not taking: Reported on 01/06/2022)    . ibuprofen (ADVIL) 600 MG tablet Take 1 tablet (600 mg total) by mouth every 6 (six) hours as needed. (Patient not taking: Reported on 01/06/2022) 30 tablet 0  . ondansetron (ZOFRAN-ODT) 4 MG disintegrating tablet Take 1 tablet (4 mg total) by mouth every 8 (eight) hours as needed for nausea or vomiting. (Patient not taking: Reported on 08/12/2021) 20 tablet 0  . Prenatal Vit-Fe Fumarate-FA (PRENATAL VITAMIN PO) Take by mouth.    .  promethazine (PHENERGAN) 25 MG tablet Take 1 tablet (25 mg total) by mouth every 6 (six) hours as needed for nausea or vomiting. (Patient not taking: Reported on 08/12/2021) 30 tablet 1   No current facility-administered medications on file prior to visit.   Allergies  Allergen Reactions  . Ceclor [Cefaclor] Hives and Nausea And Vomiting    I have reviewed patient's Past Medical Hx, Surgical Hx, Family Hx, Social Hx, medications and allergies.   Review of Systems Review of Systems  Constitutional: Negative for fever and chills.  Gastrointestinal: Negative for abdominal pain. Genitourinary: Negative for vaginal bleeding  Musculoskeletal: Negative for back pain.    Physical Exam  LMP  (LMP Unknown)   GENERAL: Well-developed, well-nourished female in no acute distress.  HEENT: Normocephalic, atraumatic.   LUNGS: Effort normal HEART: Regular rate  SKIN: Warm, dry and without erythema PSYCH: Normal mood and affect NEURO: Alert and oriented x 4  LAB RESULTS No results found for this or any previous visit (from the past 24 hour(s)).  IMAGING US OB Transvaginal  Result Date: 02/01/2022 CLINICAL DATA:  36 yo G4P2012 at unknown gestational age. Exam: OBSTETRIC <14 WK [place] and TRANSVAGINAL OB US Technique:  Transvaginal ultrasound examination was performed for complete evaluation of the gestation as well at the maternal uterus, adnexal regions, and pelvic cul-de-sac.  Transvaginal technique was performed to assess early pregnancy. Comparison:  01/15/22 Findings: Mason Jim intrauterine gestational sac noted measuring 10.1 mm. Yolk sac: Not visualized Embryo: Not visualized Cervix: Long, closed Adnexa: Normal bilaterally, corpus luteum noted on the left. Subchorionic hemorrhage:  None Other findings:  None Impression: Single intrauterine gestational sac that has over doubled in size in the last 2 weeks without an embryo with cardiac activity.  Recommendations: Based on ultrasound findings, this does  meet diagnostic criteria for failed pregnancy: there is an absence of an embryo with a heart beat > 14 days after a scan that showed a gestational sac without a yolk sac.   US OB LESS THAN 14 WEEKS WITH OB TRANSVAGINAL  Result Date: 01/15/2022 CLINICAL DATA:  Pelvic pain, vaginal bleeding EXAM: OBSTETRIC <14 WK [place] AND TRANSVAGINAL OB US TECHNIQUE: Both transabdominal and transvaginal ultrasound examinations were performed for complete evaluation of the gestation as well as the maternal uterus, adnexal regions, and pelvic cul-de-sac. Transvaginal technique was performed to assess early pregnancy. COMPARISON:  None Available. FINDINGS: Intrauterine gestational sac: Single Yolk sac:  Not seen Embryo:  Not seen Cardiac Activity: Not seen MSD: 3.8 mm   5 w   1 d Subchorionic hemorrhage:  None visualized. Maternal uterus/adnexae: Unremarkable. IMPRESSION: There is 3.8 mm fluid collection within the fundus of the uterus without demonstrable yolk sac or fetal pole. Differential diagnostic possibilities would include very early IUP or failed gestation or ectopic gestation with decidual  reaction in the uterus. Serial HCG estimations and follow-up sonogram in 1-2 weeks may be considered. There are no adnexal masses.  There is no free fluid in pelvis. Electronically Signed   By: Ernie Avena M.D.   On: 01/15/2022 17:26    ASSESSMENT 1. Pregnancy of unknown anatomic location   2. Inappropriate change in quantitative hCG in early pregnancy   [place] formal reading obtained after pt left. Does meet criteria for failed pregnancy. TOld pt this was likely before she left.  PLAN Will call pt with formal US results. Has discussed missed AB management briefly. Will confirm POC. Go to MAU as needed for heavy bleeding, abdominal pain or fever greater than 100.4.  Katrinka Blazing, [place], [place]  02/01/2022  12:56 PM

## 2022-02-02 ENCOUNTER — Telehealth: Payer: Self-pay | Admitting: Advanced Practice Midwife

## 2022-02-02 DIAGNOSIS — O034 Incomplete spontaneous abortion without complication: Secondary | ICD-10-CM

## 2022-02-02 LAB — BETA HCG QUANT (REF LAB): hCG Quant: 921 m[IU]/mL

## 2022-02-02 MED ORDER — KETOROLAC TROMETHAMINE 10 MG PO TABS: 10.0000 mg | ORAL_TABLET | Freq: Four times a day (QID) | ORAL | 0 refills | Status: AC | PRN

## 2022-02-02 MED ORDER — MISOPROSTOL 200 MCG PO TABS: 800.0000 ug | ORAL_TABLET | ORAL | 1 refills | Status: DC

## 2022-02-02 MED ORDER — ONDANSETRON HCL 4 MG PO TABS: 4.0000 mg | ORAL_TABLET | Freq: Four times a day (QID) | ORAL | 0 refills | Status: AC | PRN

## 2022-02-02 NOTE — Telephone Encounter (Signed)
Called to notify pt that Dr. Para March formally read her ultrasound from 02/01/22 and confirmed that it meets criteria for failed pregnancy. Had briefly reviewed SAB management options yesterday. Will call pt back to discuss further or she can be seen at [facility], her regular office to discuss management if she prefers.

## 2022-02-02 NOTE — Telephone Encounter (Signed)
Called back and discussed [place] confirming Missed AB. Discussed options for management including expectant management, Cytotec or D&C. Prefers expectant management at this time. Verbalizes understanding that intervention may become necessary if SAB is not completed spontaneously or if heavy bleeding or infection occur. Hopes to avoid D&C. Cytotec, Toradol and Zofran Rx's sent to pharmacy if pt decides she would like to use them. F/U at [facility] in 2 weeks. SAB precautions. Given contact info for [facility].

## 2022-02-09 ENCOUNTER — Other Ambulatory Visit: Payer: Managed Care, Other (non HMO)

## 2022-02-10 ENCOUNTER — Other Ambulatory Visit: Payer: Managed Care, Other (non HMO)

## 2022-02-17 ENCOUNTER — Encounter: Payer: Self-pay | Admitting: Obstetrics & Gynecology

## 2022-02-17 ENCOUNTER — Ambulatory Visit (INDEPENDENT_AMBULATORY_CARE_PROVIDER_SITE_OTHER): Payer: Managed Care, Other (non HMO) | Admitting: Obstetrics & Gynecology

## 2022-02-17 VITALS — BP 138/87 | HR 79 | Ht 64.0 in | Wt 220.2 lb

## 2022-02-17 DIAGNOSIS — O034 Incomplete spontaneous abortion without complication: Secondary | ICD-10-CM

## 2022-02-17 MED ORDER — MISOPROSTOL 200 MCG PO TABS
800.0000 ug | ORAL_TABLET | ORAL | 0 refills | Status: DC
Start: 2022-02-17 — End: 2022-02-24

## 2022-02-17 NOTE — Progress Notes (Signed)
   GYN VISIT Patient name: Kellie Clark MRN [id number]  Date of birth: [birth year] Chief Complaint:   No chief complaint on file.  History of Present Illness:   Kellie Clark is a 36 y.o. [id number] with known miscarriage female being seen today for follow up.  Pt seen in MAU on 8/29 diagnosed with miscarriage.  Since then she has not had any vaginal bleeding, cramping or pelvic pain.  Denies fever/chills.  No acute complaints.  Of note, pt had prior D&E Feb 2023- pt 13wks, EBL 200cc- moderate bleeding noted.  No additional medication/transfusion was needed. She would prefer to avoid surgery if possible.     No LMP recorded (lmp unknown). Patient is pregnant.     07/12/2021    2:14 PM 06/15/2021    3:23 PM  Depression screen PHQ 2/9  Decreased Interest 1 1  Down, Depressed, Hopeless 0 0  PHQ - 2 Score 1 1  Altered sleeping 1 1  Tired, decreased energy 1 1  Change in appetite 1 1  Feeling bad or failure about yourself  0 0  Trouble concentrating 0 1  Moving slowly or fidgety/restless 0 0  Suicidal thoughts 0 0  PHQ-9 Score 4 5     Review of Systems:   Pertinent items are noted in HPI Denies fever/chills, dizziness, headaches, visual disturbances, fatigue, shortness of breath, chest pain, abdominal pain, vomiting, no problems with periods, bowel movements, urination, or intercourse unless otherwise stated above.  Pertinent History Reviewed:  Reviewed past medical,surgical, social, obstetrical and family history.  Reviewed problem list, medications and allergies. Physical Assessment:   Vitals:   02/17/22 0956  BP: 138/87  Pulse: 79  Weight: 220 lb 3.2 oz (99.9 kg)  Height: 5\' 4"  (1.626 m)  Body mass index is 37.8 kg/m.       Physical Examination:   General appearance: alert, well appearing, and in no distress  Psych: mood appropriate, normal affect  Skin: warm & dry   Cardiovascular: normal heart rate noted  Respiratory: normal respiratory effort, no  distress  Abdomen: soft, non-tender, no rebound, no guarding  Pelvic: examination not indicated  Extremities: no edema   Chaperone: N/A    Bedside completed- Visible gestational sac within uterus noted  Last HCG 8/29: 921 Blood type: AB positive  Assessment & Plan:  1) Incomplete AB -discussed medical management with cytotec -reviewed precautions -f/u in 1wk -pt aware that surgery may be indicated if spontaneous miscarriage does not occur -questions/concerns were addressed  []  consider RPL work up s/p miscarriage as this is her 2nd loss   Meds ordered this encounter  Medications   misoprostol (CYTOTEC) 200 MCG tablet    Sig: Place 4 tablets (800 mcg total) vaginally daily. Repeat in 24hr if needed    Dispense:  8 tablet    Refill:  0     Return in about 1 week (around 02/24/2022) for SAB follow up.   , DO Attending Obstetrician & Gynecologist, [facility] for [name], [facility]

## 2022-02-18 ENCOUNTER — Other Ambulatory Visit: Payer: Self-pay | Admitting: Obstetrics & Gynecology

## 2022-02-18 DIAGNOSIS — O039 Complete or unspecified spontaneous abortion without complication: Secondary | ICD-10-CM

## 2022-02-18 MED ORDER — OXYCODONE HCL 5 MG PO TABS: 5.0000 mg | ORAL_TABLET | Freq: Four times a day (QID) | ORAL | 0 refills | Status: AC | PRN

## 2022-02-18 NOTE — Progress Notes (Signed)
Rx sent in for oxy if needed with cytotec

## 2022-02-24 ENCOUNTER — Encounter: Payer: Self-pay | Admitting: Obstetrics & Gynecology

## 2022-02-24 ENCOUNTER — Ambulatory Visit (INDEPENDENT_AMBULATORY_CARE_PROVIDER_SITE_OTHER): Payer: Managed Care, Other (non HMO) | Admitting: Obstetrics & Gynecology

## 2022-02-24 VITALS — BP 133/88 | HR 84 | Wt 223.0 lb

## 2022-02-24 DIAGNOSIS — O034 Incomplete spontaneous abortion without complication: Secondary | ICD-10-CM

## 2022-02-24 MED ORDER — MISOPROSTOL 200 MCG PO TABS: 400.0000 ug | ORAL_TABLET | Freq: Three times a day (TID) | ORAL | 0 refills | Status: DC

## 2022-02-24 NOTE — Progress Notes (Signed)
Follow up appointment for follow up: Pregnancy loss S/P cytotec 800 mcg x2 doses  Chief Complaint  Patient presents with   Follow-up    SAB-took Cytotec Monday-bleeding and clots heavier yesterday    Blood pressure 133/88, pulse 84, weight 223 lb (101.2 kg), not currently breastfeeding.  [id number]   Had bleeding start about 48 hours post 2nd cytotec dose Has had heavy bleeding but not really clots, no tissue that she has seen Today bleeding is more like a cycle Never really had cramping  General WDWN female NAD Vulva:  normal appearing vulva with no masses, tenderness or lesions Vagina:  normal mucosa, no discharge, some blood in the vault Cervix:  Normal no lesions Uterus:  normal size, contour, position, consistency, mobility, non-tender Adnexa: ovaries:present,  normal adnexa in size, nontender and no masses   MEDS ordered this encounter: Meds ordered this encounter  Medications   misoprostol (CYTOTEC) 200 MCG tablet    Sig: Take 2 tablets (400 mcg total) by mouth 3 (three) times daily. For 3 days    Dispense:  18 tablet    Refill:  0    Orders for this encounter: No orders of the defined types were placed in this encounter.   Impression + Management Plan   ICD-10-CM   1. Incomplete miscarriage  O03.4    vs ongoing post miscarriage bleeding, will give 3 day cours of cytotec 400 mcg x 3 days follow up in 5 days      Follow Up: No follow-ups on file.     All questions were answered.  Past Medical History:  Diagnosis Date   Anemia    Gallbladder disease    x2 weeks starting 1 week postpartum with G1   GERD (gastroesophageal reflux disease)     Past Surgical History:  Procedure Laterality Date   DILATION AND EVACUATION N/A 07/27/2021   Procedure: DILATATION AND EVACUATION;  Surgeon: [name], DO;  Location: [facility];  Service: Gynecology;  Laterality: N/A;    OB History     Gravida  4   Para  2   Term  2   Preterm      AB  2   Living  2       SAB  2   IAB      Ectopic      Multiple      Live Births  2           Allergies  Allergen Reactions   Ceclor [Cefaclor] Hives and Nausea And Vomiting    Social History   Socioeconomic History   Marital status: Married    Spouse name: Not on file   Number of children: Not on file   Years of education: Not on file   Highest education level: Not on file  Occupational History   Not on file  Tobacco Use   Smoking status: Never   Smokeless tobacco: Never  Vaping Use   Vaping Use: Never used  Substance and Sexual Activity   Alcohol use: No   Drug use: No   Sexual activity: Not Currently    Birth control/protection: None  Other Topics Concern   Not on file  Social History Narrative   Not on file   Social Determinants of Health   Financial Resource Strain: Low Risk  (06/15/2021)   Overall Financial Resource Strain (CARDIA)    Difficulty of Paying Living Expenses: Not very hard  Food Insecurity: Food Insecurity Present (06/15/2021)   Hunger Vital  Sign    Worried About Charity fundraiser in the Last Year: Sometimes true    Ran Out of Food in the Last Year: Sometimes true  Transportation Needs: No Transportation Needs (06/15/2021)   PRAPARE - Hydrologist (Medical): No    Lack of Transportation (Non-Medical): No  Physical Activity: Insufficiently Active (06/15/2021)   Exercise Vital Sign    Days of Exercise per Week: 1 day    Minutes of Exercise per Session: 10 min  Stress: No Stress Concern Present (06/15/2021)   [place]    Feeling of Stress : Only a little  Social Connections: Moderately Integrated (06/15/2021)   Social Connection and Isolation Panel [NHANES]    Frequency of Communication with Friends and Family: More than three times a week    Frequency of Social Gatherings with Friends and Family: Twice a week    Attends Religious Services: More than 4 times  per year    Active Member of [employer] or Organizations: No    Attends Music therapist: Never    Marital Status: Married    Family History  Problem Relation Age of Onset   Stroke Mother    High Cholesterol Father    Seizures Daughter    Diabetes Maternal Grandmother    Stroke Maternal Grandmother    Heart attack Maternal Grandmother    Cancer Paternal Grandmother        breast   Heart disease Paternal Grandfather    Diabetes Paternal Grandfather    Hypertension Paternal Grandfather    Sickle cell anemia Maternal Aunt

## 2022-02-25 ENCOUNTER — Encounter: Payer: Self-pay | Admitting: Obstetrics & Gynecology

## 2022-03-01 ENCOUNTER — Encounter: Payer: Self-pay | Admitting: Obstetrics & Gynecology

## 2022-03-01 ENCOUNTER — Ambulatory Visit (INDEPENDENT_AMBULATORY_CARE_PROVIDER_SITE_OTHER): Payer: Managed Care, Other (non HMO) | Admitting: Obstetrics & Gynecology

## 2022-03-01 VITALS — BP 133/89 | HR 85 | Ht 64.0 in | Wt 220.0 lb

## 2022-03-01 DIAGNOSIS — O034 Incomplete spontaneous abortion without complication: Secondary | ICD-10-CM

## 2022-03-01 NOTE — Progress Notes (Signed)
Follow up appointment for response: Incomplete AB s/p cytotec  Chief Complaint  Patient presents with   Follow-up    Blood pressure 133/89, pulse 85, height 5\' 4"  (1.626 m), weight 220 lb (99.8 kg), not currently breastfeeding.  No results found.  Pt passed the rest of the decidua etc on Saturday, day #2 of the cytotec No ongoing issues, minimal bleeding now no cramping  General WDWN female NAD Vulva:  normal appearing vulva with no masses, tenderness or lesions Vagina:  normal mucosa, no discharge Cervix:  Normal no lesions Uterus:  normal size, contour, position, consistency, mobility, non-tender on bimanual Adnexa: ovaries:present,  normal adnexa in size, nontender and no masses   MEDS ordered this encounter: No orders of the defined types were placed in this encounter.   Orders for this encounter: No orders of the defined types were placed in this encounter.   Impression + Management Plan   ICD-10-CM   1. Incomplete miscarriage, now complete with cytotec  O03.4    resolved bleeding and cramping, interested in LARC-->considering Mirena IUD      Follow Up: Return in about 4 weeks (around 03/29/2022) for IUD placement , with Dr Elonda Husky.     All questions were answered.  Past Medical History:  Diagnosis Date   Anemia    Gallbladder disease    x2 weeks starting 1 week postpartum with G1   GERD (gastroesophageal reflux disease)     Past Surgical History:  Procedure Laterality Date   DILATION AND EVACUATION N/A 07/27/2021   Procedure: DILATATION AND EVACUATION;  Surgeon: Janyth Pupa, DO;  Location: [facility];  Service: Gynecology;  Laterality: N/A;    OB History     Gravida  4   Para  2   Term  2   Preterm      AB  2   Living  2      SAB  2   IAB      Ectopic      Multiple      Live Births  2           Allergies  Allergen Reactions   Ceclor [Cefaclor] Hives and Nausea And Vomiting    Social History   Socioeconomic History    Marital status: Married    Spouse name: Not on file   Number of children: Not on file   Years of education: Not on file   Highest education level: Not on file  Occupational History   Not on file  Tobacco Use   Smoking status: Never   Smokeless tobacco: Never  Vaping Use   Vaping Use: Never used  Substance and Sexual Activity   Alcohol use: No   Drug use: No   Sexual activity: Not Currently    Birth control/protection: None  Other Topics Concern   Not on file  Social History Narrative   Not on file   Social Determinants of Health   Financial Resource Strain: Low Risk  (06/15/2021)   Overall Financial Resource Strain (CARDIA)    Difficulty of Paying Living Expenses: Not very hard  Food Insecurity: Food Insecurity Present (06/15/2021)   Hunger Vital Sign    Worried About Running Out of Food in the Last Year: Sometimes true    Ran Out of Food in the Last Year: Sometimes true  Transportation Needs: No Transportation Needs (06/15/2021)   PRAPARE - Hydrologist (Medical): No    Lack of Transportation (Non-Medical): No  Physical Activity: Insufficiently Active (06/15/2021)   Exercise Vital Sign    Days of Exercise per Week: 1 day    Minutes of Exercise per Session: 10 min  Stress: No Stress Concern Present (06/15/2021)   [employer] of Occupational Health - Occupational Stress Questionnaire    Feeling of Stress : Only a little  Social Connections: Moderately Integrated (06/15/2021)   Social Connection and Isolation Panel [NHANES]    Frequency of Communication with Friends and Family: More than three times a week    Frequency of Social Gatherings with Friends and Family: Twice a week    Attends Religious Services: More than 4 times per year    Active Member of [employer] or Organizations: No    Attends Engineer, structural: Never    Marital Status: Married    Family History  Problem Relation Age of Onset   Stroke Mother    High Cholesterol  Father    Seizures Daughter    Diabetes Maternal Grandmother    Stroke Maternal Grandmother    Heart attack Maternal Grandmother    Cancer Paternal Grandmother        breast   Heart disease Paternal Grandfather    Diabetes Paternal Grandfather    Hypertension Paternal Grandfather    Sickle cell anemia Maternal Aunt

## 2022-03-29 ENCOUNTER — Encounter: Payer: Self-pay | Admitting: Obstetrics & Gynecology

## 2022-03-29 ENCOUNTER — Ambulatory Visit: Payer: Managed Care, Other (non HMO) | Admitting: Obstetrics & Gynecology

## 2022-03-29 VITALS — BP 127/90 | HR 83 | Ht 64.0 in | Wt 222.0 lb

## 2022-03-29 DIAGNOSIS — Z3202 Encounter for pregnancy test, result negative: Secondary | ICD-10-CM | POA: Diagnosis not present

## 2022-03-29 DIAGNOSIS — Z30430 Encounter for insertion of intrauterine contraceptive device: Secondary | ICD-10-CM

## 2022-03-29 LAB — POCT URINE PREGNANCY: Preg Test, Ur: NEGATIVE

## 2022-03-29 MED ORDER — LEVONORGESTREL 20 MCG/DAY IU IUD
1.0000 | INTRAUTERINE_SYSTEM | Freq: Once | INTRAUTERINE | Status: AC
  Administered 2022-03-29: 1 via INTRAUTERINE
